# Patient Record
Sex: Female | Born: 1965 | Race: White | Hispanic: No | Marital: Married | Smoking: Never smoker
Health system: Southern US, Community
[De-identification: ages and names within clinical notes are randomized; demographics above are authoritative.]

## PROBLEM LIST (undated history)

## (undated) DIAGNOSIS — F32A Depression, unspecified: Secondary | ICD-10-CM

## (undated) DIAGNOSIS — F329 Major depressive disorder, single episode, unspecified: Secondary | ICD-10-CM

## (undated) HISTORY — DX: Major depressive disorder, single episode, unspecified: F32.9

## (undated) HISTORY — PX: BREAST LUMPECTOMY: SHX2

## (undated) HISTORY — PX: BREAST EXCISIONAL BIOPSY: SUR124

## (undated) HISTORY — PX: FOOT SURGERY: SHX648

## (undated) HISTORY — PX: NASAL SINUS SURGERY: SHX719

## (undated) HISTORY — DX: Depression, unspecified: F32.A

---

## 2005-08-09 ENCOUNTER — Encounter: Payer: Self-pay | Admitting: Family Medicine

## 2005-08-09 ENCOUNTER — Ambulatory Visit: Payer: Self-pay | Admitting: Obstetrics & Gynecology

## 2006-04-19 ENCOUNTER — Ambulatory Visit: Payer: Self-pay | Admitting: Family Medicine

## 2006-05-14 ENCOUNTER — Ambulatory Visit: Payer: Self-pay | Admitting: Family Medicine

## 2006-05-21 ENCOUNTER — Ambulatory Visit (HOSPITAL_COMMUNITY): Admission: RE | Admit: 2006-05-21 | Discharge: 2006-05-21 | Payer: Self-pay | Admitting: Gynecology

## 2006-05-29 ENCOUNTER — Ambulatory Visit (HOSPITAL_COMMUNITY): Admission: RE | Admit: 2006-05-29 | Discharge: 2006-05-29 | Payer: Self-pay | Admitting: Gynecology

## 2006-06-25 ENCOUNTER — Ambulatory Visit: Payer: Self-pay | Admitting: Family Medicine

## 2007-04-02 ENCOUNTER — Ambulatory Visit: Payer: Self-pay | Admitting: Family Medicine

## 2008-04-07 ENCOUNTER — Ambulatory Visit: Payer: Self-pay | Admitting: Family Medicine

## 2008-04-20 ENCOUNTER — Ambulatory Visit: Payer: Self-pay | Admitting: Obstetrics & Gynecology

## 2008-05-12 ENCOUNTER — Ambulatory Visit: Payer: Self-pay | Admitting: Obstetrics & Gynecology

## 2009-04-27 ENCOUNTER — Ambulatory Visit: Payer: Self-pay | Admitting: Family Medicine

## 2010-05-09 ENCOUNTER — Ambulatory Visit: Payer: Self-pay | Admitting: Obstetrics and Gynecology

## 2010-05-22 ENCOUNTER — Ambulatory Visit: Payer: 59 | Admitting: Obstetrics & Gynecology

## 2010-05-22 DIAGNOSIS — Z01419 Encounter for gynecological examination (general) (routine) without abnormal findings: Secondary | ICD-10-CM

## 2010-05-22 DIAGNOSIS — Z124 Encounter for screening for malignant neoplasm of cervix: Secondary | ICD-10-CM

## 2010-05-23 NOTE — Assessment & Plan Note (Signed)
[name], LEASON NO.:  [id number]  MEDICAL RECORD NO.:  [id number]           PATIENT TYPE:  LOCATION:  [facility] at [facility]           FACILITY:  PHYSICIAN:  Argentina Donovan, MD             DATE OF BIRTH:  DATE OF SERVICE:  05/22/2010                                 CLINIC NOTE  The patient is a 45 year old Caucasian female gravida 1, para 0-0-1-0 with no complaints.  She had been on Wellbutrin which she stopped sometime ago.  She has been fine, since then she uses a NuvaRing for contraception.  She has no complaints.  PHYSICAL EXAMINATION:  NECK:  Thyroid symmetrical.  No dominant masses, no nipple discharge, no supraclavicular, axillary nodes. Abdomen:  Soft, flat, and nontender.  No masses or organomegaly. GENITALIA:  External is normal.  BUS within normal limits.  Vagina is clean and well rugated.  Cervix is clean and nulliparous.  The uterus is anterior normal size, shape, consistency.  The adnexa could not be palpated. VITAL SIGNS:  The patient has a 130/84 blood pressure, 165 pounds, and is 5 feet 5 inches tall.  Normal gynecological examination by impression.  Prescription for NuvaRing and the patient will be scheduled for mammogram.          ______________________________ Argentina Donovan, MD    PR/MEDQ  D:  05/22/2010  T:  05/23/2010  Job:  [id number]

## 2010-06-14 ENCOUNTER — Ambulatory Visit: Payer: Self-pay | Admitting: Obstetrics and Gynecology

## 2010-06-27 NOTE — Assessment & Plan Note (Signed)
NAMEEVANGELYN, [name]                ACCOUNT NO.:  [id number]   MEDICAL RECORD NO.:  [id number]          PATIENT TYPE:  POB   LOCATION:  [facility] at [facility]         FACILITY:  [facility]   PHYSICIAN:  Tinnie Gens, MD        DATE OF BIRTH:  Jun 13, 1965   DATE OF SERVICE:                                  CLINIC NOTE   CHIEF COMPLAINT:  Yearly exam.   HISTORY OF PRESENT ILLNESS:  The patient is a 45 year old gravida 1,  para 0 is here for yearly exam.  She is without significant complaint  today.  She reports that she has really not been on the pills since her  miscarriage which was May of last year.  The patient some months does  not use anything and other months is still using condoms for birth  control.  She has regular cycles that last approximately 5 days but she  does have spotting on and off for 2 to 3 days.  She does desire me to  give her something for birth control in case she decides she is going to  use it.  She also is complaining of some mild bilateral pelvic pain and  left sided abdominal pain. She does have some endometriosis.  The  patient also complains of fatigue and she has gotten worse over the past  few months.   PAST MEDICAL HISTORY:  Negative.   PAST SURGICAL HISTORY:  1. Lumpectomy right breast.  2. Two foot surgeries.  3. Sinus surgery.   ALLERGIES:  None known.   MEDICATIONS:  Multivitamin somedays.   GYN HISTORY:  LMP 03/19/07, no history of STD or abnormal Pap smear. See  HPI.   OBSTETRICAL HISTORY:  G1, P0 with one SAB.   FAMILY HISTORY:  Paternal aunt with breast cancer.  Father had non-  Hodgkin's lymphoma.   SOCIAL HISTORY:  She works Education officer, environmental houses. No tobacco or drug use.  Social alcohol use.   REVIEW OF SYSTEMS:  12 point systems reviewed, negative for headache,  vision change, nausea, vomiting, diarrhea, constipation, chest pain,  shortness of breath, abdominal pain, lower extremity swelling, breast  mass, nipple discharge, vaginal  discharge or abnormal vaginal lesions.   PHYSICAL EXAMINATION:  VITALS:  On exam weight is 186, blood pressure is  127/77, pulse 96.  GENERAL:  She is a well-nourished female in no acute distress.  HEENT:  Normocephalic, atraumatic.  Sclerae anicteric.  NECK:  Supple.  Normal thyroid.  LUNGS:  Clear bilaterally.  CV:  Regular rate and rhythm. No murmurs, gallops or murmurs.  ABDOMEN:  Soft, nontender, nondistended.  No masses.  EXTREMITIES:  No cyanosis, clubbing or edema.  2+ distal pulses.  BREASTS:  Symmetric with everted nipples.  No masses. No supraclavicular  or axillary adenopathy.  GU:  Normal external female genitalia.  BUS is normal.  Vagina is pink  and rugated.  Cervix is nulliparous without lesion.  Uterus is small,  anteverted.  Right adnexa __________ , there may be some fullness on the  left side with some mild tenderness associated with that.   IMPRESSION:  1. Yearly exam.  2. Left-sided pelvic pain.  3.  Fatigue.   PLAN:  Pap smear today.  TSH, CBC, BMP, lipid panel, GYN ultrasound for  left lower quadrant pain. Follow up in several weeks to review these  results.           ______________________________  Tinnie Gens, MD     TP/MEDQ  D:  04/02/2007  T:  04/03/2007  Job:  [id number]

## 2010-06-27 NOTE — Assessment & Plan Note (Signed)
NAMEVICKIE, [name]                ACCOUNT NO.:  [id number]   MEDICAL RECORD NO.:  [id number]          PATIENT TYPE:  POB   LOCATION:  [facility]         FACILITY:  [facility]   PHYSICIAN:  Tinnie Gens, MD        DATE OF BIRTH:  02-10-1966   DATE OF SERVICE:  04/07/2008                                  CLINIC NOTE   CHIEF COMPLAINT:  Yearly exam.   HISTORY OF PRESENT ILLNESS:  The patient is a 45 year old gravida 1,  para 0 who is here for yearly exam.  She is without significant  complaint today.  She has previously been prescribed Wellbutrin, which  she has started taking again as it makes her feel better.  She has a  strong family history of depression and just feels like she does better,  is less worried, and less agitated when she is on the medication.  This  has not been written for her for over a year, but she had an old  prescription which she started taking, and then she borrowed some from  her brother.  She would like to continue this.  She is currently on the  NuvaRing which works well for her.  She does have some spotting, but she  reports that has been better over the last 6 months.  She was interested  in discussion of IUD versus continuing her NuvaRing.   PAST MEDICAL HISTORY:  Negative.   PAST SURGICAL HISTORY:  Lumpectomy of right breast, two foot surgeries,  and a sinus surgery.   ALLERGIES:  None known.   MEDICATIONS:  She is currently on multivitamin daily and NuvaRing as  directed.   GYNECOLOGIC HISTORY:  LMP, March 21, 2008.  Last Pap, April 02, 2007, was normal.  No history of STD.   OBSTETRICAL HISTORY:  G1, P0, 1 SAB.   FAMILY HISTORY:  Hypertension in her mother, paternal aunt with breast  cancer, and father with non-Hodgkin lymphoma.   SOCIAL HISTORY:  She works Education officer, environmental houses.  No tobacco or drug use.  Social alcohol user.   REVIEW OF SYSTEMS:  A 14-point review of systems is reviewed, negative  for headache, vision changes,  nausea, vomiting, diarrhea, constipation,  blood in her stools, blood in her urine, difficulty passing urine, chest  pain, shortness of breath, abdominal pain, lower extremity swelling,  breast mass, nipple discharge, vaginal discharge, or pelvic pain.   PHYSICAL EXAMINATION:  VITAL SIGNS:  Her weight is down to 181.  She is  currently using [employer] for this.  Blood pressure is 130/85  today.  GENERAL:  She is a well-nourished, well-nourished female in no acute  distress.  HEENT:  Normocephalic, atraumatic.  Sclerae anicteric.  NECK:  Supple.  Normal thyroid.  LUNGS:  Clear bilaterally.  CV:  Regular rate and rhythm.  No rubs, gallops, murmurs.  ABDOMEN:  Soft, nontender, nondistended.  No masses.  EXTREMITIES:  No cyanosis, clubbing, or edema.  Distal pulses 2+.  BREASTS:  Symmetric with everted nipples.  No masses.  No  supraclavicular or axillary adenopathy noted.  Well-healed scar over the  patient's right nipple is noted.  GU:  Normal external female genitalia, BUS normal.  Vagina is pink and  rugated.  Cervix is nulliparous without lesion.  Uterus is small,  approximately 6-8 weeks size, anteverted.  No adnexal mass or  tenderness.   IMPRESSION:  1. Yearly exam.  2. Depression.   PLAN:  1. Pap smear today.  2. Scheduled for mammogram as the patient has reached the age of 53.      The patient had full lab panel last year which was all normal prior      to this and does not need this repeated for 3-5 years.  3. I have refilled her NuvaRing for the next year.  4. I have given her a prescription for Wellbutrin XR 150 mg 1 p.o.      daily #30 p.r.n. refills.  The patient is also given information      about Mirena IUD and will call back to schedule this if she is      interested.           ______________________________  Tinnie Gens, MD     TP/MEDQ  D:  04/07/2008  T:  04/07/2008  Job:  [id number]
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
## 2010-06-30 NOTE — Group Therapy Note (Signed)
[name], [name]                ACCOUNT NO.:  [id number]   MEDICAL RECORD NO.:  [id number]          PATIENT TYPE:  POB   LOCATION:  [facility]                   FACILITY:  [facility]   PHYSICIAN:  Tinnie Gens, MD        DATE OF BIRTH:  Apr 30, 1965   DATE OF SERVICE:  08/09/2005                                    CLINIC NOTE   CHIEF COMPLAINT:  Physical exam.   HISTORY OF PRESENT ILLNESS:  The patient is a 45 year old nulligravida who  comes in for her yearly exam.  She is typically followed by Dr. Mia Creek.  She has previously been on multiple different doses of estrogen for birth  control and reports some bright red bleeding with almost all of the  different pills she has been on lately.  She would like to change to a new  kind of birth control.  Otherwise, she is without complaints.  She is  considering fertility sometime in the future.  She continues on a  multivitamin a day.   PAST MEDICAL HISTORY:  Negative.   PAST SURGICAL HISTORY:  Includes a lumpectomy of a cyst on her right breast  and two foot surgeries and sinus surgery.   ALLERGIES:  NONE KNOWN.   MEDICATIONS:  Loestrin 24, multivitamin daily.   GYNECOLOGICAL HISTORY:  LMP Jul 09, 2005, last Pap July 24, 2004, last  mammogram June 03, 2002.  She denies STDs or abnormal Pap smears.  Cycles  are regular.   FAMILY HISTORY:  Significant for breast cancer.  She has a paternal aunt who  had breast cancer.  Her father is recently deceased of non-Hodgkin lymphoma.   SOCIAL HISTORY:  No tobacco, social alcohol use.   OBSTETRICAL HISTORY:  She is nulligravida.   REVIEW OF SYSTEMS:  A 14-point review of systems reviewed and is negative  for headache, vision changes, nausea, vomiting, diarrhea, constipation,  chest pain, shortness of breath, abdominal pain, lower extremity swelling,  breast masses, nipple discharge, vaginal discharge, or abnormal vaginal  lesions such as in the HPI.   PHYSICAL EXAMINATION:  VITAL SIGNS:   Blood pressure 124/64, weight 174,  pulse 68.  GENERAL:  She is a well-developed, well-nourished white female in no acute  distress.  NECK:  Supple with normal thyroid.  LUNGS:  Clear bilaterally.  CARDIOVASCULAR:  Regular rate and rhythm without rubs, gallops, or murmurs.  ABDOMEN:  Soft, nontender, nondistended.  BREASTS:  Symmetric with everted nipples, no supraclavicular axillary  adenopathy, no breast masses noted.  EXTREMITIES:  No cyanosis, clubbing, or edema.  GU:  Normal external female genitalia, the vagina is pink and rugated, BUS  was normal.  The cervix is normal without lesions.  The uterus is small and  anteverted without adnexal mass or tenderness.   IMPRESSION:  1.  Yearly exam.  2.  Abnormal bleeding with Loestrin estrogen pill.   PLAN:  1.  Pap smear today.  2.  Trial of NuvaRing.  The patient was given samples.  She will call back      in if this is not working for her for a  change in pills.  We will try a      135 pill with her next.  If she likes the NuvaRing we can call in a      prescription for that.  She will let us know how she is doing.           ______________________________  Tinnie Gens, MD     TP/MEDQ  D:  08/09/2005  T:  08/09/2005  Job:  [id number]

## 2011-08-06 ENCOUNTER — Other Ambulatory Visit: Payer: Self-pay | Admitting: Family Medicine

## 2011-08-06 ENCOUNTER — Ambulatory Visit (INDEPENDENT_AMBULATORY_CARE_PROVIDER_SITE_OTHER): Payer: 59 | Admitting: Family Medicine

## 2011-08-06 ENCOUNTER — Encounter: Payer: Self-pay | Admitting: Family Medicine

## 2011-08-06 VITALS — BP 123/84 | HR 91 | Ht 65.0 in | Wt 177.0 lb

## 2011-08-06 DIAGNOSIS — Z124 Encounter for screening for malignant neoplasm of cervix: Secondary | ICD-10-CM

## 2011-08-06 DIAGNOSIS — Z309 Encounter for contraceptive management, unspecified: Secondary | ICD-10-CM

## 2011-08-06 DIAGNOSIS — Z1151 Encounter for screening for human papillomavirus (HPV): Secondary | ICD-10-CM

## 2011-08-06 DIAGNOSIS — E669 Obesity, unspecified: Secondary | ICD-10-CM

## 2011-08-06 DIAGNOSIS — IMO0001 Reserved for inherently not codable concepts without codable children: Secondary | ICD-10-CM

## 2011-08-06 DIAGNOSIS — Z01419 Encounter for gynecological examination (general) (routine) without abnormal findings: Secondary | ICD-10-CM

## 2011-08-06 MED ORDER — ETONOGESTREL-ETHINYL ESTRADIOL 0.12-0.015 MG/24HR VA RING: VAGINAL_RING | VAGINAL | Status: DC

## 2011-08-06 MED ORDER — PHENTERMINE HCL 37.5 MG PO CAPS: 37.5000 mg | ORAL_CAPSULE | ORAL | Status: DC

## 2011-08-06 NOTE — Patient Instructions (Addendum)
Exercise to Lose Weight Exercise and a healthy diet may help you lose weight. Your doctor may suggest specific exercises. EXERCISE IDEAS AND TIPS  Choose low-cost things you enjoy doing, such as walking, bicycling, or exercising to workout videos.   Take stairs instead of the elevator.   Walk during your lunch break.   Park your car further away from work or school.   Go to a gym or an exercise class.   Start with 5 to 10 minutes of exercise each day. Build up to 30 minutes of exercise 4 to 6 days a week.   Wear shoes with good support and comfortable clothes.   Stretch before and after working out.   Work out until you breathe harder and your heart beats faster.   Drink extra water when you exercise.   Do not do so much that you hurt yourself, feel dizzy, or get very short of breath.  Exercises that burn about 150 calories:  Running 1  miles in 15 minutes.   Playing volleyball for 45 to 60 minutes.   Washing and waxing a car for 45 to 60 minutes.   Playing touch football for 45 minutes.   Walking 1  miles in 35 minutes.   Pushing a stroller 1  miles in 30 minutes.   Playing basketball for 30 minutes.   Raking leaves for 30 minutes.   Bicycling 5 miles in 30 minutes.   Walking 2 miles in 30 minutes.   Dancing for 30 minutes.   Shoveling snow for 15 minutes.   Swimming laps for 20 minutes.   Walking up stairs for 15 minutes.   Bicycling 4 miles in 15 minutes.   Gardening for 30 to 45 minutes.   Jumping rope for 15 minutes.   Washing windows or floors for 45 to 60 minutes.  Document Released: 03/03/2010 Document Revised: 01/18/2011 Document Reviewed: 03/03/2010 [facility] Patient Information 2012 [place], [place].Preventive Care for Adults, Female A healthy lifestyle and preventive care can promote health and wellness. Preventive health guidelines for women include the following key practices.  A routine yearly physical is a good way to check with your  caregiver about your health and preventive screening. It is a chance to share any concerns and updates on your health, and to receive a thorough exam.   Visit your dentist for a routine exam and preventive care every 6 months. Brush your teeth twice a day and floss once a day. Good oral hygiene prevents tooth decay and gum disease.   The frequency of eye exams is based on your age, health, family medical history, use of contact lenses, and other factors. Follow your caregiver's recommendations for frequency of eye exams.   Eat a healthy diet. Foods like vegetables, fruits, whole grains, low-fat dairy products, and lean protein foods contain the nutrients you need without too many calories. Decrease your intake of foods high in solid fats, added sugars, and salt. Eat the right amount of calories for you.Get information about a proper diet from your caregiver, if necessary.   Regular physical exercise is one of the most important things you can do for your health. Most adults should get at least 150 minutes of moderate-intensity exercise (any activity that increases your heart rate and causes you to sweat) each week. In addition, most adults need muscle-strengthening exercises on 2 or more days a week.   Maintain a healthy weight. The body mass index (BMI) is a screening tool to identify possible weight problems. It  provides an estimate of body fat based on height and weight. Your caregiver can help determine your BMI, and can help you achieve or maintain a healthy weight.For adults 20 years and older:   A BMI below 18.5 is considered underweight.   A BMI of 18.5 to 24.9 is normal.   A BMI of 25 to 29.9 is considered overweight.   A BMI of 30 and above is considered obese.   Maintain normal blood lipids and cholesterol levels by exercising and minimizing your intake of saturated fat. Eat a balanced diet with plenty of fruit and vegetables. Blood tests for lipids and cholesterol should begin at  age 58 and be repeated every 5 years. If your lipid or cholesterol levels are high, you are over 50, or you are at high risk for heart disease, you may need your cholesterol levels checked more frequently.Ongoing high lipid and cholesterol levels should be treated with medicines if diet and exercise are not effective.   If you smoke, find out from your caregiver how to quit. If you do not use tobacco, do not start.   If you are pregnant, do not drink alcohol. If you are breastfeeding, be very cautious about drinking alcohol. If you are not pregnant and choose to drink alcohol, do not exceed 1 drink per day. One drink is considered to be 12 ounces (355 mL) of beer, 5 ounces (148 mL) of wine, or 1.5 ounces (44 mL) of liquor.   Avoid use of street drugs. Do not share needles with anyone. Ask for help if you need support or instructions about stopping the use of drugs.   High blood pressure causes heart disease and increases the risk of stroke. Your blood pressure should be checked at least every 1 to 2 years. Ongoing high blood pressure should be treated with medicines if weight loss and exercise are not effective.   If you are 1 to 44 years old, ask your caregiver if you should take aspirin to prevent strokes.   Diabetes screening involves taking a blood sample to check your fasting blood sugar level. This should be done once every 3 years, after age 28, if you are within normal weight and without risk factors for diabetes. Testing should be considered at a younger age or be carried out more frequently if you are overweight and have at least 1 risk factor for diabetes.   Breast cancer screening is essential preventive care for women. You should practice "breast self-awareness." This means understanding the normal appearance and feel of your breasts and may include breast self-examination. Any changes detected, no matter how small, should be reported to a caregiver. Women in their 40s and 30s should  have a clinical breast exam (CBE) by a caregiver as part of a regular health exam every 1 to 3 years. After age 63, women should have a CBE every year. Starting at age 35, women should consider having a mammography (breast X-ray test) every year. Women who have a family history of breast cancer should talk to their caregiver about genetic screening. Women at a high risk of breast cancer should talk to their caregivers about having magnetic resonance imaging (MRI) and a mammography every year.   The Pap test is a screening test for cervical cancer. A Pap test can show cell changes on the cervix that might become cervical cancer if left untreated. A Pap test is a procedure in which cells are obtained and examined from the lower end of the uterus (  cervix).   Women should have a Pap test starting at age 15.   Between ages 13 and 46, Pap tests should be repeated every 2 years.   Beginning at age 14, you should have a Pap test every 3 years as long as the past 3 Pap tests have been normal.   Some women have medical problems that increase the chance of getting cervical cancer. Talk to your caregiver about these problems. It is especially important to talk to your caregiver if a new problem develops soon after your last Pap test. In these cases, your caregiver may recommend more frequent screening and Pap tests.   The above recommendations are the same for women who have or have not gotten the vaccine for human papillomavirus (HPV).   If you had a hysterectomy for a problem that was not cancer or a condition that could lead to cancer, then you no longer need Pap tests. Even if you no longer need a Pap test, a regular exam is a good idea to make sure no other problems are starting.   If you are between ages 41 and 2, and you have had normal Pap tests going back 10 years, you no longer need Pap tests. Even if you no longer need a Pap test, a regular exam is a good idea to make sure no other problems are  starting.   If you have had past treatment for cervical cancer or a condition that could lead to cancer, you need Pap tests and screening for cancer for at least 20 years after your treatment.   If Pap tests have been discontinued, risk factors (such as a new sexual partner) need to be reassessed to determine if screening should be resumed.   The HPV test is an additional test that may be used for cervical cancer screening. The HPV test looks for the virus that can cause the cell changes on the cervix. The cells collected during the Pap test can be tested for HPV. The HPV test could be used to screen women aged 87 years and older, and should be used in women of any age who have unclear Pap test results. After the age of 36, women should have HPV testing at the same frequency as a Pap test.   Colorectal cancer can be detected and often prevented. Most routine colorectal cancer screening begins at the age of 10 and continues through age 23. However, your caregiver may recommend screening at an earlier age if you have risk factors for colon cancer. On a yearly basis, your caregiver may provide home test kits to check for hidden blood in the stool. Use of a small camera at the end of a tube, to directly examine the colon (sigmoidoscopy or colonoscopy), can detect the earliest forms of colorectal cancer. Talk to your caregiver about this at age 88, when routine screening begins. Direct examination of the colon should be repeated every 5 to 10 years through age 35, unless early forms of pre-cancerous polyps or small growths are found.   Hepatitis C blood testing is recommended for all people born from [birth year] through 1965 and any individual with known risks for hepatitis C.   Practice safe sex. Use condoms and avoid high-risk sexual practices to reduce the spread of sexually transmitted infections (STIs). STIs include gonorrhea, chlamydia, syphilis, trichomonas, herpes, HPV, and human immunodeficiency virus  (HIV). Herpes, HIV, and HPV are viral illnesses that have no cure. They can result in disability, cancer, and death. Sexually  active women aged 27 and younger should be checked for chlamydia. Older women with new or multiple partners should also be tested for chlamydia. Testing for other STIs is recommended if you are sexually active and at increased risk.   Osteoporosis is a disease in which the bones lose minerals and strength with aging. This can result in serious bone fractures. The risk of osteoporosis can be identified using a bone density scan. Women ages 39 and over and women at risk for fractures or osteoporosis should discuss screening with their caregivers. Ask your caregiver whether you should take a calcium supplement or vitamin D to reduce the rate of osteoporosis.   Menopause can be associated with physical symptoms and risks. Hormone replacement therapy is available to decrease symptoms and risks. You should talk to your caregiver about whether hormone replacement therapy is right for you.   Use sunscreen with sun protection factor (SPF) of 30 or more. Apply sunscreen liberally and repeatedly throughout the day. You should seek shade when your shadow is shorter than you. Protect yourself by wearing long sleeves, pants, a wide-brimmed hat, and sunglasses year round, whenever you are outdoors.   Once a month, do a whole body skin exam, using a mirror to look at the skin on your back. Notify your caregiver of new moles, moles that have irregular borders, moles that are larger than a pencil eraser, or moles that have changed in shape or color.   Stay current with required immunizations.   Influenza. You need a dose every fall (or winter). The composition of the flu vaccine changes each year, so being vaccinated once is not enough.   Pneumococcal polysaccharide. You need 1 to 2 doses if you smoke cigarettes or if you have certain chronic medical conditions. You need 1 dose at age 51 (or  older) if you have never been vaccinated.   Tetanus, diphtheria, pertussis (Tdap, Td). Get 1 dose of Tdap vaccine if you are younger than age 35, are over 30 and have contact with an infant, are a Research scientist (physical sciences), are pregnant, or simply want to be protected from whooping cough. After that, you need a Td booster dose every 10 years. Consult your caregiver if you have not had at least 3 tetanus and diphtheria-containing shots sometime in your life or have a deep or dirty wound.   HPV. You need this vaccine if you are a woman age 65 or younger. The vaccine is given in 3 doses over 6 months.   Measles, mumps, rubella (MMR). You need at least 1 dose of MMR if you were born in 1957 or later. You may also need a second dose.   Meningococcal. If you are age 33 to 75 and a first-year college student living in a residence hall, or have one of several medical conditions, you need to get vaccinated against meningococcal disease. You may also need additional booster doses.   Zoster (shingles). If you are age 52 or older, you should get this vaccine.   Varicella (chickenpox). If you have never had chickenpox or you were vaccinated but received only 1 dose, talk to your caregiver to find out if you need this vaccine.   Hepatitis A. You need this vaccine if you have a specific risk factor for hepatitis A virus infection or you simply wish to be protected from this disease. The vaccine is usually given as 2 doses, 6 to 18 months apart.   Hepatitis B. You need this vaccine if you have a  specific risk factor for hepatitis B virus infection or you simply wish to be protected from this disease. The vaccine is given in 3 doses, usually over 6 months.  Preventive Services / Frequency Ages 85 to 1  Blood pressure check.** / Every 1 to 2 years.   Lipid and cholesterol check.** / Every 5 years beginning at age 66.   Clinical breast exam.** / Every 3 years for women in their 7s and 30s.   Pap test.** / Every 2  years from ages 36 through 67. Every 3 years starting at age 71 through age 18 or 87 with a history of 3 consecutive normal Pap tests.   HPV screening.** / Every 3 years from ages 79 through ages 34 to 38 with a history of 3 consecutive normal Pap tests.   Hepatitis C blood test.** / For any individual with known risks for hepatitis C.   Skin self-exam. / Monthly.   Influenza immunization.** / Every year.   Pneumococcal polysaccharide immunization.** / 1 to 2 doses if you smoke cigarettes or if you have certain chronic medical conditions.   Tetanus, diphtheria, pertussis (Tdap, Td) immunization. / A one-time dose of Tdap vaccine. After that, you need a Td booster dose every 10 years.   HPV immunization. / 3 doses over 6 months, if you are 9 and younger.   Measles, mumps, rubella (MMR) immunization. / You need at least 1 dose of MMR if you were born in 1957 or later. You may also need a second dose.   Meningococcal immunization. / 1 dose if you are age 31 to 25 and a first-year college student living in a residence hall, or have one of several medical conditions, you need to get vaccinated against meningococcal disease. You may also need additional booster doses.   Varicella immunization.** / Consult your caregiver.   Hepatitis A immunization.** / Consult your caregiver. 2 doses, 6 to 18 months apart.   Hepatitis B immunization.** / Consult your caregiver. 3 doses usually over 6 months.  Ages 4 to 45  Blood pressure check.** / Every 1 to 2 years.   Lipid and cholesterol check.** / Every 5 years beginning at age 40.   Clinical breast exam.** / Every year after age 21.   Mammogram.** / Every year beginning at age 71 and continuing for as long as you are in good health. Consult with your caregiver.   Pap test.** / Every 3 years starting at age 3 through age 14 or 39 with a history of 3 consecutive normal Pap tests.   HPV screening.** / Every 3 years from ages 52 through ages 64 to  104 with a history of 3 consecutive normal Pap tests.   Fecal occult blood test (FOBT) of stool. / Every year beginning at age 20 and continuing until age 61. You may not need to do this test if you get a colonoscopy every 10 years.   Flexible sigmoidoscopy or colonoscopy.** / Every 5 years for a flexible sigmoidoscopy or every 10 years for a colonoscopy beginning at age 33 and continuing until age 39.   Hepatitis C blood test.** / For all people born from [birth year] through 1963 and any individual with known risks for hepatitis C.   Skin self-exam. / Monthly.   Influenza immunization.** / Every year.   Pneumococcal polysaccharide immunization.** / 1 to 2 doses if you smoke cigarettes or if you have certain chronic medical conditions.   Tetanus, diphtheria, pertussis (Tdap, Td) immunization.** / A  one-time dose of Tdap vaccine. After that, you need a Td booster dose every 10 years.   Measles, mumps, rubella (MMR) immunization. / You need at least 1 dose of MMR if you were born in 1957 or later. You may also need a second dose.   Varicella immunization.** / Consult your caregiver.   Meningococcal immunization.** / Consult your caregiver.   Hepatitis A immunization.** / Consult your caregiver. 2 doses, 6 to 18 months apart.   Hepatitis B immunization.** / Consult your caregiver. 3 doses, usually over 6 months.  Ages 46 and over  Blood pressure check.** / Every 1 to 2 years.   Lipid and cholesterol check.** / Every 5 years beginning at age 81.   Clinical breast exam.** / Every year after age 57.   Mammogram.** / Every year beginning at age 55 and continuing for as long as you are in good health. Consult with your caregiver.   Pap test.** / Every 3 years starting at age 25 through age 69 or 62 with a 3 consecutive normal Pap tests. Testing can be stopped between 65 and 70 with 3 consecutive normal Pap tests and no abnormal Pap or HPV tests in the past 10 years.   HPV screening.** / Every 3  years from ages 45 through ages 23 or 52 with a history of 3 consecutive normal Pap tests. Testing can be stopped between 65 and 70 with 3 consecutive normal Pap tests and no abnormal Pap or HPV tests in the past 10 years.   Fecal occult blood test (FOBT) of stool. / Every year beginning at age 69 and continuing until age 82. You may not need to do this test if you get a colonoscopy every 10 years.   Flexible sigmoidoscopy or colonoscopy.** / Every 5 years for a flexible sigmoidoscopy or every 10 years for a colonoscopy beginning at age 43 and continuing until age 75.   Hepatitis C blood test.** / For all people born from [birth year] through 1965 and any individual with known risks for hepatitis C.   Osteoporosis screening.** / A one-time screening for women ages 68 and over and women at risk for fractures or osteoporosis.   Skin self-exam. / Monthly.   Influenza immunization.** / Every year.   Pneumococcal polysaccharide immunization.** / 1 dose at age 76 (or older) if you have never been vaccinated.   Tetanus, diphtheria, pertussis (Tdap, Td) immunization. / A one-time dose of Tdap vaccine if you are over 65 and have contact with an infant, are a Research scientist (physical sciences), or simply want to be protected from whooping cough. After that, you need a Td booster dose every 10 years.   Varicella immunization.** / Consult your caregiver.   Meningococcal immunization.** / Consult your caregiver.   Hepatitis A immunization.** / Consult your caregiver. 2 doses, 6 to 18 months apart.   Hepatitis B immunization.** / Check with your caregiver. 3 doses, usually over 6 months.  ** Family history and personal history of risk and conditions may change your caregiver's recommendations. Document Released: 03/27/2001 Document Revised: 01/18/2011 Document Reviewed: 06/26/2010 [facility] Patient Information 2012 [place], [place].

## 2011-08-06 NOTE — Progress Notes (Signed)
Yearly exam/pap/tn

## 2011-08-06 NOTE — Progress Notes (Signed)
  Subjective:     [name] is a 46 y.o. female and is here for a comprehensive physical exam. The patient reports problems - interested in weight loss.  Having some spotting with Nuva Ring but otherwise happy with this method.  Became a Grandmother recently.Marland Kitchen  History   Social History  . Marital Status: Married    Spouse Name: N/A    Number of Children: N/A  . Years of Education: N/A   Occupational History  . Not on file.   Social History Main Topics  . Smoking status: Never Smoker   . Smokeless tobacco: Never Used  . Alcohol Use: No  . Drug Use: No  . Sexually Active: Yes -- Female partner(s)    Birth Control/ Protection: Inserts     nuva ring   Other Topics Concern  . Not on file   Social History Narrative  . No narrative on file   Health Maintenance  Topic Date Due  . Pap Smear  01/07/1986  . Tetanus/tdap  01/08/1987  . Influenza Vaccine  11/13/2011    The following portions of the patient's history were reviewed and updated as appropriate: allergies, current medications, past family history, past medical history, past social history, past surgical history and problem list.  Review of Systems A comprehensive review of systems was negative.   Objective:    BP 123/84  Pulse 91  Ht 5\' 5"  (1.651 m)  Wt 177 lb (80.287 kg)  BMI 29.45 kg/m2  LMP 07/23/2011 General appearance: alert, cooperative and appears stated age Head: Normocephalic, without obvious abnormality, atraumatic Neck: no adenopathy, supple, symmetrical, trachea midline and thyroid not enlarged, symmetric, no tenderness/mass/nodules Lungs: clear to auscultation bilaterally Breasts: normal appearance, no masses or tenderness Heart: regular rate and rhythm, S1, S2 normal, no murmur, click, rub or gallop Abdomen: soft, non-tender; bowel sounds normal; no masses,  no organomegaly Pelvic: cervix normal in appearance, external genitalia normal, no adnexal masses or tenderness, no cervical motion  tenderness, uterus normal size, shape, and consistency, vagina normal without discharge and Nuva ring in place Extremities: extremities normal, atraumatic, no cyanosis or edema Pulses: 2+ and symmetric Skin: Skin color, texture, turgor normal. No rashes or lesions Lymph nodes: Cervical, supraclavicular, and axillary nodes normal. Neurologic: Grossly normal    Assessment:    Healthy female exam. Obesity     Plan:  Pap smear, Mammogram, two months of Phentermine.  Gets yearly labs with [facility]. See After Visit Summary for Counseling Recommendations

## 2011-08-11 LAB — PAP LB AND HPV HIGH-RISK: HPV, high-risk: NEGATIVE

## 2011-08-13 ENCOUNTER — Encounter: Payer: Self-pay | Admitting: Family Medicine

## 2012-10-29 ENCOUNTER — Ambulatory Visit (INDEPENDENT_AMBULATORY_CARE_PROVIDER_SITE_OTHER): Payer: 59 | Admitting: Obstetrics and Gynecology

## 2012-10-29 ENCOUNTER — Encounter: Payer: Self-pay | Admitting: Obstetrics and Gynecology

## 2012-10-29 VITALS — BP 131/99 | HR 81 | Ht 65.0 in | Wt 175.0 lb

## 2012-10-29 DIAGNOSIS — Z124 Encounter for screening for malignant neoplasm of cervix: Secondary | ICD-10-CM

## 2012-10-29 DIAGNOSIS — Z01419 Encounter for gynecological examination (general) (routine) without abnormal findings: Secondary | ICD-10-CM

## 2012-10-29 NOTE — Progress Notes (Addendum)
  Subjective:     [name] is a 47 y.o. female G1P0 with LMP 10/17/2012 with BMI 29 who is here for a comprehensive physical exam. The patient reports no problems. Patient is still using NuvaRing for contraception without any issues.  History   Social History  . Marital Status: Married    Spouse Name: N/A    Number of Children: N/A  . Years of Education: N/A   Occupational History  . Not on file.   Social History Main Topics  . Smoking status: Never Smoker   . Smokeless tobacco: Never Used  . Alcohol Use: No  . Drug Use: No  . Sexual Activity: Yes    Partners: Male    Birth Control/ Protection: Inserts     Comment: nuva ring   Other Topics Concern  . Not on file   Social History Narrative  . No narrative on file   Health Maintenance  Topic Date Due  . Tetanus/tdap  01/08/1987  . Influenza Vaccine  09/12/2012  . Pap Smear  08/13/2014   Past Medical History  Diagnosis Date  . Depression    Past Surgical History  Procedure Laterality Date  . Breast lumpectomy      RIGHT BREAST  . Nasal sinus surgery    . Foot surgery      X2   Family History  Problem Relation Age of Onset  . Cancer Paternal Aunt     BREAST  . Cancer Father     Non-Hodgkins Lymphoma  . Hypertension Mother    History  Substance Use Topics  . Smoking status: Never Smoker   . Smokeless tobacco: Never Used  . Alcohol Use: No       Review of Systems A comprehensive review of systems was negative.   Objective:      GENERAL: Well-developed, well-nourished female in no acute distress. Obese HEENT: Normocephalic, atraumatic. Sclerae anicteric.  NECK: Supple. Normal thyroid.  LUNGS: Clear to auscultation bilaterally.  HEART: Regular rate and rhythm. BREASTS: Symmetric in size. No palpable masses or lymphadenopathy, skin changes, or nipple drainage. ABDOMEN: Soft, nontender, nondistended. Obese PELVIC: Normal external female genitalia. Vagina is pink and rugated.  Normal discharge.  Normal appearing cervix. NuvaRing in place. Uterus is normal in size. No adnexal mass or tenderness. EXTREMITIES: No cyanosis, clubbing, or edema, 2+ distal pulses.    Assessment:    Healthy female exam.      Plan:    pap smear collected Referral for screening mammography provided Encouraged to continue weight loss efforts Also advised to follow up with PCP regarding elevated BP. Patient states that her BP is always elevated only when she comes to MD office. Informed patients of risks associated with the use of combined hormonal contraception and HTN. Advised to perform monthly self breast and vulva exams See After Visit Summary for Counseling Recommendations

## 2012-10-29 NOTE — Patient Instructions (Signed)
Preventive Care for Adults, Female A healthy lifestyle and preventive care can promote health and wellness. Preventive health guidelines for women include the following key practices.  A routine yearly physical is a good way to check with your caregiver about your health and preventive screening. It is a chance to share any concerns and updates on your health, and to receive a thorough exam.  Visit your dentist for a routine exam and preventive care every 6 months. Brush your teeth twice a day and floss once a day. Good oral hygiene prevents tooth decay and gum disease.  The frequency of eye exams is based on your age, health, family medical history, use of contact lenses, and other factors. Follow your caregiver's recommendations for frequency of eye exams.  Eat a healthy diet. Foods like vegetables, fruits, whole grains, low-fat dairy products, and lean protein foods contain the nutrients you need without too many calories. Decrease your intake of foods high in solid fats, added sugars, and salt. Eat the right amount of calories for you.Get information about a proper diet from your caregiver, if necessary.  Regular physical exercise is one of the most important things you can do for your health. Most adults should get at least 150 minutes of moderate-intensity exercise (any activity that increases your heart rate and causes you to sweat) each week. In addition, most adults need muscle-strengthening exercises on 2 or more days a week.  Maintain a healthy weight. The body mass index (BMI) is a screening tool to identify possible weight problems. It provides an estimate of body fat based on height and weight. Your caregiver can help determine your BMI, and can help you achieve or maintain a healthy weight.For adults 20 years and older:  A BMI below 18.5 is considered underweight.  A BMI of 18.5 to 24.9 is normal.  A BMI of 25 to 29.9 is considered overweight.  A BMI of 30 and above is  considered obese.  Maintain normal blood lipids and cholesterol levels by exercising and minimizing your intake of saturated fat. Eat a balanced diet with plenty of fruit and vegetables. Blood tests for lipids and cholesterol should begin at age 20 and be repeated every 5 years. If your lipid or cholesterol levels are high, you are over 50, or you are at high risk for heart disease, you may need your cholesterol levels checked more frequently.Ongoing high lipid and cholesterol levels should be treated with medicines if diet and exercise are not effective.  If you smoke, find out from your caregiver how to quit. If you do not use tobacco, do not start.  If you are pregnant, do not drink alcohol. If you are breastfeeding, be very cautious about drinking alcohol. If you are not pregnant and choose to drink alcohol, do not exceed 1 drink per day. One drink is considered to be 12 ounces (355 mL) of beer, 5 ounces (148 mL) of wine, or 1.5 ounces (44 mL) of liquor.  Avoid use of street drugs. Do not share needles with anyone. Ask for help if you need support or instructions about stopping the use of drugs.  High blood pressure causes heart disease and increases the risk of stroke. Your blood pressure should be checked at least every 1 to 2 years. Ongoing high blood pressure should be treated with medicines if weight loss and exercise are not effective.  If you are 55 to 45 years old, ask your caregiver if you should take aspirin to prevent strokes.  Diabetes   screening involves taking a blood sample to check your fasting blood sugar level. This should be done once every 3 years, after age 45, if you are within normal weight and without risk factors for diabetes. Testing should be considered at a younger age or be carried out more frequently if you are overweight and have at least 1 risk factor for diabetes.  Breast cancer screening is essential preventive care for women. You should practice "breast  self-awareness." This means understanding the normal appearance and feel of your breasts and may include breast self-examination. Any changes detected, no matter how small, should be reported to a caregiver. Women in their 20s and 30s should have a clinical breast exam (CBE) by a caregiver as part of a regular health exam every 1 to 3 years. After age 40, women should have a CBE every year. Starting at age 40, women should consider having a mammography (breast X-ray test) every year. Women who have a family history of breast cancer should talk to their caregiver about genetic screening. Women at a high risk of breast cancer should talk to their caregivers about having magnetic resonance imaging (MRI) and a mammography every year.  The Pap test is a screening test for cervical cancer. A Pap test can show cell changes on the cervix that might become cervical cancer if left untreated. A Pap test is a procedure in which cells are obtained and examined from the lower end of the uterus (cervix).  Women should have a Pap test starting at age 21.  Between ages 21 and 29, Pap tests should be repeated every 2 years.  Beginning at age 30, you should have a Pap test every 3 years as long as the past 3 Pap tests have been normal.  Some women have medical problems that increase the chance of getting cervical cancer. Talk to your caregiver about these problems. It is especially important to talk to your caregiver if a new problem develops soon after your last Pap test. In these cases, your caregiver may recommend more frequent screening and Pap tests.  The above recommendations are the same for women who have or have not gotten the vaccine for human papillomavirus (HPV).  If you had a hysterectomy for a problem that was not cancer or a condition that could lead to cancer, then you no longer need Pap tests. Even if you no longer need a Pap test, a regular exam is a good idea to make sure no other problems are  starting.  If you are between ages 65 and 70, and you have had normal Pap tests going back 10 years, you no longer need Pap tests. Even if you no longer need a Pap test, a regular exam is a good idea to make sure no other problems are starting.  If you have had past treatment for cervical cancer or a condition that could lead to cancer, you need Pap tests and screening for cancer for at least 20 years after your treatment.  If Pap tests have been discontinued, risk factors (such as a new sexual partner) need to be reassessed to determine if screening should be resumed.  The HPV test is an additional test that may be used for cervical cancer screening. The HPV test looks for the virus that can cause the cell changes on the cervix. The cells collected during the Pap test can be tested for HPV. The HPV test could be used to screen women aged 30 years and older, and should   be used in women of any age who have unclear Pap test results. After the age of 30, women should have HPV testing at the same frequency as a Pap test.  Colorectal cancer can be detected and often prevented. Most routine colorectal cancer screening begins at the age of 50 and continues through age 75. However, your caregiver may recommend screening at an earlier age if you have risk factors for colon cancer. On a yearly basis, your caregiver may provide home test kits to check for hidden blood in the stool. Use of a small camera at the end of a tube, to directly examine the colon (sigmoidoscopy or colonoscopy), can detect the earliest forms of colorectal cancer. Talk to your caregiver about this at age 50, when routine screening begins. Direct examination of the colon should be repeated every 5 to 10 years through age 75, unless early forms of pre-cancerous polyps or small growths are found.  Hepatitis C blood testing is recommended for all people born from 1945 through 1965 and any individual with known risks for hepatitis C.  Practice  safe sex. Use condoms and avoid high-risk sexual practices to reduce the spread of sexually transmitted infections (STIs). STIs include gonorrhea, chlamydia, syphilis, trichomonas, herpes, HPV, and human immunodeficiency virus (HIV). Herpes, HIV, and HPV are viral illnesses that have no cure. They can result in disability, cancer, and death. Sexually active women aged 25 and younger should be checked for chlamydia. Older women with new or multiple partners should also be tested for chlamydia. Testing for other STIs is recommended if you are sexually active and at increased risk.  Osteoporosis is a disease in which the bones lose minerals and strength with aging. This can result in serious bone fractures. The risk of osteoporosis can be identified using a bone density scan. Women ages 65 and over and women at risk for fractures or osteoporosis should discuss screening with their caregivers. Ask your caregiver whether you should take a calcium supplement or vitamin D to reduce the rate of osteoporosis.  Menopause can be associated with physical symptoms and risks. Hormone replacement therapy is available to decrease symptoms and risks. You should talk to your caregiver about whether hormone replacement therapy is right for you.  Use sunscreen with sun protection factor (SPF) of 30 or more. Apply sunscreen liberally and repeatedly throughout the day. You should seek shade when your shadow is shorter than you. Protect yourself by wearing long sleeves, pants, a wide-brimmed hat, and sunglasses year round, whenever you are outdoors.  Once a month, do a whole body skin exam, using a mirror to look at the skin on your back. Notify your caregiver of new moles, moles that have irregular borders, moles that are larger than a pencil eraser, or moles that have changed in shape or color.  Stay current with required immunizations.  Influenza. You need a dose every fall (or winter). The composition of the flu vaccine  changes each year, so being vaccinated once is not enough.  Pneumococcal polysaccharide. You need 1 to 2 doses if you smoke cigarettes or if you have certain chronic medical conditions. You need 1 dose at age 65 (or older) if you have never been vaccinated.  Tetanus, diphtheria, pertussis (Tdap, Td). Get 1 dose of Tdap vaccine if you are younger than age 65, are over 65 and have contact with an infant, are a healthcare worker, are pregnant, or simply want to be protected from whooping cough. After that, you need a Td   booster dose every 10 years. Consult your caregiver if you have not had at least 3 tetanus and diphtheria-containing shots sometime in your life or have a deep or dirty wound.  HPV. You need this vaccine if you are a woman age 26 or younger. The vaccine is given in 3 doses over 6 months.  Measles, mumps, rubella (MMR). You need at least 1 dose of MMR if you were born in 1957 or later. You may also need a second dose.  Meningococcal. If you are age 19 to 21 and a first-year college student living in a residence hall, or have one of several medical conditions, you need to get vaccinated against meningococcal disease. You may also need additional booster doses.  Zoster (shingles). If you are age 60 or older, you should get this vaccine.  Varicella (chickenpox). If you have never had chickenpox or you were vaccinated but received only 1 dose, talk to your caregiver to find out if you need this vaccine.  Hepatitis A. You need this vaccine if you have a specific risk factor for hepatitis A virus infection or you simply wish to be protected from this disease. The vaccine is usually given as 2 doses, 6 to 18 months apart.  Hepatitis B. You need this vaccine if you have a specific risk factor for hepatitis B virus infection or you simply wish to be protected from this disease. The vaccine is given in 3 doses, usually over 6 months. Preventive Services / Frequency Ages 19 to 39  Blood  pressure check.** / Every 1 to 2 years.  Lipid and cholesterol check.** / Every 5 years beginning at age 20.  Clinical breast exam.** / Every 3 years for women in their 20s and 30s.  Pap test.** / Every 2 years from ages 21 through 29. Every 3 years starting at age 30 through age 65 or 70 with a history of 3 consecutive normal Pap tests.  HPV screening.** / Every 3 years from ages 30 through ages 65 to 70 with a history of 3 consecutive normal Pap tests.  Hepatitis C blood test.** / For any individual with known risks for hepatitis C.  Skin self-exam. / Monthly.  Influenza immunization.** / Every year.  Pneumococcal polysaccharide immunization.** / 1 to 2 doses if you smoke cigarettes or if you have certain chronic medical conditions.  Tetanus, diphtheria, pertussis (Tdap, Td) immunization. / A one-time dose of Tdap vaccine. After that, you need a Td booster dose every 10 years.  HPV immunization. / 3 doses over 6 months, if you are 26 and younger.  Measles, mumps, rubella (MMR) immunization. / You need at least 1 dose of MMR if you were born in 1957 or later. You may also need a second dose.  Meningococcal immunization. / 1 dose if you are age 19 to 21 and a first-year college student living in a residence hall, or have one of several medical conditions, you need to get vaccinated against meningococcal disease. You may also need additional booster doses.  Varicella immunization.** / Consult your caregiver.  Hepatitis A immunization.** / Consult your caregiver. 2 doses, 6 to 18 months apart.  Hepatitis B immunization.** / Consult your caregiver. 3 doses usually over 6 months. Ages 40 to 64  Blood pressure check.** / Every 1 to 2 years.  Lipid and cholesterol check.** / Every 5 years beginning at age 20.  Clinical breast exam.** / Every year after age 40.  Mammogram.** / Every year beginning at age 40   and continuing for as long as you are in good health. Consult with your  caregiver.  Pap test.** / Every 3 years starting at age 30 through age 65 or 70 with a history of 3 consecutive normal Pap tests.  HPV screening.** / Every 3 years from ages 30 through ages 65 to 70 with a history of 3 consecutive normal Pap tests.  Fecal occult blood test (FOBT) of stool. / Every year beginning at age 50 and continuing until age 75. You may not need to do this test if you get a colonoscopy every 10 years.  Flexible sigmoidoscopy or colonoscopy.** / Every 5 years for a flexible sigmoidoscopy or every 10 years for a colonoscopy beginning at age 50 and continuing until age 75.  Hepatitis C blood test.** / For all people born from 1945 through 1965 and any individual with known risks for hepatitis C.  Skin self-exam. / Monthly.  Influenza immunization.** / Every year.  Pneumococcal polysaccharide immunization.** / 1 to 2 doses if you smoke cigarettes or if you have certain chronic medical conditions.  Tetanus, diphtheria, pertussis (Tdap, Td) immunization.** / A one-time dose of Tdap vaccine. After that, you need a Td booster dose every 10 years.  Measles, mumps, rubella (MMR) immunization. / You need at least 1 dose of MMR if you were born in 1957 or later. You may also need a second dose.  Varicella immunization.** / Consult your caregiver.  Meningococcal immunization.** / Consult your caregiver.  Hepatitis A immunization.** / Consult your caregiver. 2 doses, 6 to 18 months apart.  Hepatitis B immunization.** / Consult your caregiver. 3 doses, usually over 6 months. Ages 65 and over  Blood pressure check.** / Every 1 to 2 years.  Lipid and cholesterol check.** / Every 5 years beginning at age 20.  Clinical breast exam.** / Every year after age 40.  Mammogram.** / Every year beginning at age 40 and continuing for as long as you are in good health. Consult with your caregiver.  Pap test.** / Every 3 years starting at age 30 through age 65 or 70 with a 3  consecutive normal Pap tests. Testing can be stopped between 65 and 70 with 3 consecutive normal Pap tests and no abnormal Pap or HPV tests in the past 10 years.  HPV screening.** / Every 3 years from ages 30 through ages 65 or 70 with a history of 3 consecutive normal Pap tests. Testing can be stopped between 65 and 70 with 3 consecutive normal Pap tests and no abnormal Pap or HPV tests in the past 10 years.  Fecal occult blood test (FOBT) of stool. / Every year beginning at age 50 and continuing until age 75. You may not need to do this test if you get a colonoscopy every 10 years.  Flexible sigmoidoscopy or colonoscopy.** / Every 5 years for a flexible sigmoidoscopy or every 10 years for a colonoscopy beginning at age 50 and continuing until age 75.  Hepatitis C blood test.** / For all people born from 1945 through 1965 and any individual with known risks for hepatitis C.  Osteoporosis screening.** / A one-time screening for women ages 65 and over and women at risk for fractures or osteoporosis.  Skin self-exam. / Monthly.  Influenza immunization.** / Every year.  Pneumococcal polysaccharide immunization.** / 1 dose at age 65 (or older) if you have never been vaccinated.  Tetanus, diphtheria, pertussis (Tdap, Td) immunization. / A one-time dose of Tdap vaccine if you are over   65 and have contact with an infant, are a healthcare worker, or simply want to be protected from whooping cough. After that, you need a Td booster dose every 10 years.  Varicella immunization.** / Consult your caregiver.  Meningococcal immunization.** / Consult your caregiver.  Hepatitis A immunization.** / Consult your caregiver. 2 doses, 6 to 18 months apart.  Hepatitis B immunization.** / Check with your caregiver. 3 doses, usually over 6 months. ** Family history and personal history of risk and conditions may change your caregiver's recommendations. Document Released: 03/27/2001 Document Revised: 04/23/2011  Document Reviewed: 06/26/2010 [employer] Patient Information 2014 [employer].  

## 2012-10-29 NOTE — Addendum Note (Signed)
Addended by: Barbara Cower on: 10/29/2012 10:11 AM   Modules accepted: Orders

## 2012-11-04 LAB — PAP LB, RFX HPV ALL PTH

## 2013-03-04 ENCOUNTER — Telehealth: Payer: Self-pay | Admitting: *Deleted

## 2013-03-04 DIAGNOSIS — Z01419 Encounter for gynecological examination (general) (routine) without abnormal findings: Secondary | ICD-10-CM

## 2013-03-04 DIAGNOSIS — IMO0001 Reserved for inherently not codable concepts without codable children: Secondary | ICD-10-CM

## 2013-03-04 MED ORDER — ETONOGESTREL-ETHINYL ESTRADIOL 0.12-0.015 MG/24HR VA RING: VAGINAL_RING | VAGINAL | Status: DC

## 2013-03-04 NOTE — Telephone Encounter (Signed)
Patient needs refill of her nuva ring sent to her mail order company.

## 2013-12-14 ENCOUNTER — Encounter: Payer: Self-pay | Admitting: Obstetrics and Gynecology

## 2014-05-19 ENCOUNTER — Encounter: Payer: Self-pay | Admitting: Family Medicine

## 2014-05-19 ENCOUNTER — Ambulatory Visit (INDEPENDENT_AMBULATORY_CARE_PROVIDER_SITE_OTHER): Payer: 59 | Admitting: Family Medicine

## 2014-05-19 VITALS — BP 142/86 | HR 73 | Ht 65.0 in | Wt 177.2 lb

## 2014-05-19 DIAGNOSIS — Z3049 Encounter for surveillance of other contraceptives: Secondary | ICD-10-CM

## 2014-05-19 DIAGNOSIS — E669 Obesity, unspecified: Secondary | ICD-10-CM

## 2014-05-19 DIAGNOSIS — Z01419 Encounter for gynecological examination (general) (routine) without abnormal findings: Secondary | ICD-10-CM

## 2014-05-19 DIAGNOSIS — Z124 Encounter for screening for malignant neoplasm of cervix: Secondary | ICD-10-CM | POA: Diagnosis not present

## 2014-05-19 MED ORDER — PHENTERMINE HCL 37.5 MG PO CAPS: 37.5000 mg | ORAL_CAPSULE | ORAL | Status: DC

## 2014-05-19 MED ORDER — ETONOGESTREL-ETHINYL ESTRADIOL 0.12-0.015 MG/24HR VA RING: VAGINAL_RING | VAGINAL | Status: DC

## 2014-05-19 NOTE — Patient Instructions (Signed)
Preventive Care for Adults A healthy lifestyle and preventive care can promote health and wellness. Preventive health guidelines for women include the following key practices.  A routine yearly physical is a good way to check with your health care provider about your health and preventive screening. It is a chance to share any concerns and updates on your health and to receive a thorough exam.  Visit your dentist for a routine exam and preventive care every 6 months. Brush your teeth twice a day and floss once a day. Good oral hygiene prevents tooth decay and gum disease.  The frequency of eye exams is based on your age, health, family medical history, use of contact lenses, and other factors. Follow your health care provider's recommendations for frequency of eye exams.  Eat a healthy diet. Foods like vegetables, fruits, whole grains, low-fat dairy products, and lean protein foods contain the nutrients you need without too many calories. Decrease your intake of foods high in solid fats, added sugars, and salt. Eat the right amount of calories for you.Get information about a proper diet from your health care provider, if necessary.  Regular physical exercise is one of the most important things you can do for your health. Most adults should get at least 150 minutes of moderate-intensity exercise (any activity that increases your heart rate and causes you to sweat) each week. In addition, most adults need muscle-strengthening exercises on 2 or more days a week.  Maintain a healthy weight. The body mass index (BMI) is a screening tool to identify possible weight problems. It provides an estimate of body fat based on height and weight. Your health care provider can find your BMI and can help you achieve or maintain a healthy weight.For adults 20 years and older:  A BMI below 18.5 is considered underweight.  A BMI of 18.5 to 24.9 is normal.  A BMI of 25 to 29.9 is considered overweight.  A BMI of  30 and above is considered obese.  Maintain normal blood lipids and cholesterol levels by exercising and minimizing your intake of saturated fat. Eat a balanced diet with plenty of fruit and vegetables. Blood tests for lipids and cholesterol should begin at age 76 and be repeated every 5 years. If your lipid or cholesterol levels are high, you are over 50, or you are at high risk for heart disease, you may need your cholesterol levels checked more frequently.Ongoing high lipid and cholesterol levels should be treated with medicines if diet and exercise are not working.  If you smoke, find out from your health care provider how to quit. If you do not use tobacco, do not start.  Lung cancer screening is recommended for adults aged 22-80 years who are at high risk for developing lung cancer because of a history of smoking. A yearly low-dose CT scan of the lungs is recommended for people who have at least a 30-pack-year history of smoking and are a current smoker or have quit within the past 15 years. A pack year of smoking is smoking an average of 1 pack of cigarettes a day for 1 year (for example: 1 pack a day for 30 years or 2 packs a day for 15 years). Yearly screening should continue until the smoker has stopped smoking for at least 15 years. Yearly screening should be stopped for people who develop a health problem that would prevent them from having lung cancer treatment.  If you are pregnant, do not drink alcohol. If you are breastfeeding,  be very cautious about drinking alcohol. If you are not pregnant and choose to drink alcohol, do not have more than 1 drink per day. One drink is considered to be 12 ounces (355 mL) of beer, 5 ounces (148 mL) of wine, or 1.5 ounces (44 mL) of liquor.  Avoid use of street drugs. Do not share needles with anyone. Ask for help if you need support or instructions about stopping the use of drugs.  High blood pressure causes heart disease and increases the risk of  stroke. Your blood pressure should be checked at least every 1 to 2 years. Ongoing high blood pressure should be treated with medicines if weight loss and exercise do not work.  If you are 75-52 years old, ask your health care provider if you should take aspirin to prevent strokes.  Diabetes screening involves taking a blood sample to check your fasting blood sugar level. This should be done once every 3 years, after age 15, if you are within normal weight and without risk factors for diabetes. Testing should be considered at a younger age or be carried out more frequently if you are overweight and have at least 1 risk factor for diabetes.  Breast cancer screening is essential preventive care for women. You should practice "breast self-awareness." This means understanding the normal appearance and feel of your breasts and may include breast self-examination. Any changes detected, no matter how small, should be reported to a health care provider. Women in their 58s and 30s should have a clinical breast exam (CBE) by a health care provider as part of a regular health exam every 1 to 3 years. After age 16, women should have a CBE every year. Starting at age 53, women should consider having a mammogram (breast X-ray test) every year. Women who have a family history of breast cancer should talk to their health care provider about genetic screening. Women at a high risk of breast cancer should talk to their health care providers about having an MRI and a mammogram every year.  Breast cancer gene (BRCA)-related cancer risk assessment is recommended for women who have family members with BRCA-related cancers. BRCA-related cancers include breast, ovarian, tubal, and peritoneal cancers. Having family members with these cancers may be associated with an increased risk for harmful changes (mutations) in the breast cancer genes BRCA1 and BRCA2. Results of the assessment will determine the need for genetic counseling and  BRCA1 and BRCA2 testing.  Routine pelvic exams to screen for cancer are no longer recommended for nonpregnant women who are considered low risk for cancer of the pelvic organs (ovaries, uterus, and vagina) and who do not have symptoms. Ask your health care provider if a screening pelvic exam is right for you.  If you have had past treatment for cervical cancer or a condition that could lead to cancer, you need Pap tests and screening for cancer for at least 20 years after your treatment. If Pap tests have been discontinued, your risk factors (such as having a new sexual partner) need to be reassessed to determine if screening should be resumed. Some women have medical problems that increase the chance of getting cervical cancer. In these cases, your health care provider may recommend more frequent screening and Pap tests.  The HPV test is an additional test that may be used for cervical cancer screening. The HPV test looks for the virus that can cause the cell changes on the cervix. The cells collected during the Pap test can be  tested for HPV. The HPV test could be used to screen women aged 30 years and older, and should be used in women of any age who have unclear Pap test results. After the age of 30, women should have HPV testing at the same frequency as a Pap test.  Colorectal cancer can be detected and often prevented. Most routine colorectal cancer screening begins at the age of 50 years and continues through age 75 years. However, your health care provider may recommend screening at an earlier age if you have risk factors for colon cancer. On a yearly basis, your health care provider may provide home test kits to check for hidden blood in the stool. Use of a small camera at the end of a tube, to directly examine the colon (sigmoidoscopy or colonoscopy), can detect the earliest forms of colorectal cancer. Talk to your health care provider about this at age 50, when routine screening begins. Direct  exam of the colon should be repeated every 5-10 years through age 75 years, unless early forms of pre-cancerous polyps or small growths are found.  People who are at an increased risk for hepatitis B should be screened for this virus. You are considered at high risk for hepatitis B if:  You were born in a country where hepatitis B occurs often. Talk with your health care provider about which countries are considered high risk.  Your parents were born in a high-risk country and you have not received a shot to protect against hepatitis B (hepatitis B vaccine).  You have HIV or AIDS.  You use needles to inject street drugs.  You live with, or have sex with, someone who has hepatitis B.  You get hemodialysis treatment.  You take certain medicines for conditions like cancer, organ transplantation, and autoimmune conditions.  Hepatitis C blood testing is recommended for all people born from 1945 through 1965 and any individual with known risks for hepatitis C.  Practice safe sex. Use condoms and avoid high-risk sexual practices to reduce the spread of sexually transmitted infections (STIs). STIs include gonorrhea, chlamydia, syphilis, trichomonas, herpes, HPV, and human immunodeficiency virus (HIV). Herpes, HIV, and HPV are viral illnesses that have no cure. They can result in disability, cancer, and death.  You should be screened for sexually transmitted illnesses (STIs) including gonorrhea and chlamydia if:  You are sexually active and are younger than 24 years.  You are older than 24 years and your health care provider tells you that you are at risk for this type of infection.  Your sexual activity has changed since you were last screened and you are at an increased risk for chlamydia or gonorrhea. Ask your health care provider if you are at risk.  If you are at risk of being infected with HIV, it is recommended that you take a prescription medicine daily to prevent HIV infection. This is  called preexposure prophylaxis (PrEP). You are considered at risk if:  You are a heterosexual woman, are sexually active, and are at increased risk for HIV infection.  You take drugs by injection.  You are sexually active with a partner who has HIV.  Talk with your health care provider about whether you are at high risk of being infected with HIV. If you choose to begin PrEP, you should first be tested for HIV. You should then be tested every 3 months for as long as you are taking PrEP.  Osteoporosis is a disease in which the bones lose minerals and strength   with aging. This can result in serious bone fractures or breaks. The risk of osteoporosis can be identified using a bone density scan. Women ages 65 years and over and women at risk for fractures or osteoporosis should discuss screening with their health care providers. Ask your health care provider whether you should take a calcium supplement or vitamin D to reduce the rate of osteoporosis.  Menopause can be associated with physical symptoms and risks. Hormone replacement therapy is available to decrease symptoms and risks. You should talk to your health care provider about whether hormone replacement therapy is right for you.  Use sunscreen. Apply sunscreen liberally and repeatedly throughout the day. You should seek shade when your shadow is shorter than you. Protect yourself by wearing long sleeves, pants, a wide-brimmed hat, and sunglasses year round, whenever you are outdoors.  Once a month, do a whole body skin exam, using a mirror to look at the skin on your back. Tell your health care provider of new moles, moles that have irregular borders, moles that are larger than a pencil eraser, or moles that have changed in shape or color.  Stay current with required vaccines (immunizations).  Influenza vaccine. All adults should be immunized every year.  Tetanus, diphtheria, and acellular pertussis (Td, Tdap) vaccine. Pregnant women should  receive 1 dose of Tdap vaccine during each pregnancy. The dose should be obtained regardless of the length of time since the last dose. Immunization is preferred during the 27th-36th week of gestation. An adult who has not previously received Tdap or who does not know her vaccine status should receive 1 dose of Tdap. This initial dose should be followed by tetanus and diphtheria toxoids (Td) booster doses every 10 years. Adults with an unknown or incomplete history of completing a 3-dose immunization series with Td-containing vaccines should begin or complete a primary immunization series including a Tdap dose. Adults should receive a Td booster every 10 years.  Varicella vaccine. An adult without evidence of immunity to varicella should receive 2 doses or a second dose if she has previously received 1 dose. Pregnant females who do not have evidence of immunity should receive the first dose after pregnancy. This first dose should be obtained before leaving the health care facility. The second dose should be obtained 4-8 weeks after the first dose.  Human papillomavirus (HPV) vaccine. Females aged 13-26 years who have not received the vaccine previously should obtain the 3-dose series. The vaccine is not recommended for use in pregnant females. However, pregnancy testing is not needed before receiving a dose. If a female is found to be pregnant after receiving a dose, no treatment is needed. In that case, the remaining doses should be delayed until after the pregnancy. Immunization is recommended for any person with an immunocompromised condition through the age of 26 years if she did not get any or all doses earlier. During the 3-dose series, the second dose should be obtained 4-8 weeks after the first dose. The third dose should be obtained 24 weeks after the first dose and 16 weeks after the second dose.  Zoster vaccine. One dose is recommended for adults aged 60 years or older unless certain conditions are  present.  Measles, mumps, and rubella (MMR) vaccine. Adults born before 1957 generally are considered immune to measles and mumps. Adults born in 1957 or later should have 1 or more doses of MMR vaccine unless there is a contraindication to the vaccine or there is laboratory evidence of immunity to   each of the three diseases. A routine second dose of MMR vaccine should be obtained at least 28 days after the first dose for students attending postsecondary schools, health care workers, or international travelers. People who received inactivated measles vaccine or an unknown type of measles vaccine during 1963-1967 should receive 2 doses of MMR vaccine. People who received inactivated mumps vaccine or an unknown type of mumps vaccine before 1979 and are at high risk for mumps infection should consider immunization with 2 doses of MMR vaccine. For females of childbearing age, rubella immunity should be determined. If there is no evidence of immunity, females who are not pregnant should be vaccinated. If there is no evidence of immunity, females who are pregnant should delay immunization until after pregnancy. Unvaccinated health care workers born before 1957 who lack laboratory evidence of measles, mumps, or rubella immunity or laboratory confirmation of disease should consider measles and mumps immunization with 2 doses of MMR vaccine or rubella immunization with 1 dose of MMR vaccine.  Pneumococcal 13-valent conjugate (PCV13) vaccine. When indicated, a person who is uncertain of her immunization history and has no record of immunization should receive the PCV13 vaccine. An adult aged 19 years or older who has certain medical conditions and has not been previously immunized should receive 1 dose of PCV13 vaccine. This PCV13 should be followed with a dose of pneumococcal polysaccharide (PPSV23) vaccine. The PPSV23 vaccine dose should be obtained at least 8 weeks after the dose of PCV13 vaccine. An adult aged 19  years or older who has certain medical conditions and previously received 1 or more doses of PPSV23 vaccine should receive 1 dose of PCV13. The PCV13 vaccine dose should be obtained 1 or more years after the last PPSV23 vaccine dose.  Pneumococcal polysaccharide (PPSV23) vaccine. When PCV13 is also indicated, PCV13 should be obtained first. All adults aged 65 years and older should be immunized. An adult younger than age 65 years who has certain medical conditions should be immunized. Any person who resides in a nursing home or long-term care facility should be immunized. An adult smoker should be immunized. People with an immunocompromised condition and certain other conditions should receive both PCV13 and PPSV23 vaccines. People with human immunodeficiency virus (HIV) infection should be immunized as soon as possible after diagnosis. Immunization during chemotherapy or radiation therapy should be avoided. Routine use of PPSV23 vaccine is not recommended for American Indians, [place] Natives, or people younger than 65 years unless there are medical conditions that require PPSV23 vaccine. When indicated, people who have unknown immunization and have no record of immunization should receive PPSV23 vaccine. One-time revaccination 5 years after the first dose of PPSV23 is recommended for people aged 19-64 years who have chronic kidney failure, nephrotic syndrome, asplenia, or immunocompromised conditions. People who received 1-2 doses of PPSV23 before age 65 years should receive another dose of PPSV23 vaccine at age 65 years or later if at least 5 years have passed since the previous dose. Doses of PPSV23 are not needed for people immunized with PPSV23 at or after age 65 years.  Meningococcal vaccine. Adults with asplenia or persistent complement component deficiencies should receive 2 doses of quadrivalent meningococcal conjugate (MenACWY-D) vaccine. The doses should be obtained at least 2 months apart.  Microbiologists working with certain meningococcal bacteria, military recruits, people at risk during an outbreak, and people who travel to or live in countries with a high rate of meningitis should be immunized. A first-year college student up through age   21 years who is living in a residence hall should receive a dose if she did not receive a dose on or after her 16th birthday. Adults who have certain high-risk conditions should receive one or more doses of vaccine.  Hepatitis A vaccine. Adults who wish to be protected from this disease, have certain high-risk conditions, work with hepatitis A-infected animals, work in hepatitis A research labs, or travel to or work in countries with a high rate of hepatitis A should be immunized. Adults who were previously unvaccinated and who anticipate close contact with an international adoptee during the first 60 days after arrival in the [place] from a country with a high rate of hepatitis A should be immunized.  Hepatitis B vaccine. Adults who wish to be protected from this disease, have certain high-risk conditions, may be exposed to blood or other infectious body fluids, are household contacts or sex partners of hepatitis B positive people, are clients or workers in certain care facilities, or travel to or work in countries with a high rate of hepatitis B should be immunized.  Haemophilus influenzae type b (Hib) vaccine. A previously unvaccinated person with asplenia or sickle cell disease or having a scheduled splenectomy should receive 1 dose of Hib vaccine. Regardless of previous immunization, a recipient of a hematopoietic stem cell transplant should receive a 3-dose series 6-12 months after her successful transplant. Hib vaccine is not recommended for adults with HIV infection. Preventive Services / Frequency Ages 64 to 68 years  Blood pressure check.** / Every 1 to 2 years.  Lipid and cholesterol check.** / Every 5 years beginning at age  22.  Clinical breast exam.** / Every 3 years for women in their 88s and 53s.  BRCA-related cancer risk assessment.** / For women who have family members with a BRCA-related cancer (breast, ovarian, tubal, or peritoneal cancers).  Pap test.** / Every 2 years from ages 90 through 51. Every 3 years starting at age 21 through age 56 or 3 with a history of 3 consecutive normal Pap tests.  HPV screening.** / Every 3 years from ages 24 through ages 1 to 46 with a history of 3 consecutive normal Pap tests.  Hepatitis C blood test.** / For any individual with known risks for hepatitis C.  Skin self-exam. / Monthly.  Influenza vaccine. / Every year.  Tetanus, diphtheria, and acellular pertussis (Tdap, Td) vaccine.** / Consult your health care provider. Pregnant women should receive 1 dose of Tdap vaccine during each pregnancy. 1 dose of Td every 10 years.  Varicella vaccine.** / Consult your health care provider. Pregnant females who do not have evidence of immunity should receive the first dose after pregnancy.  HPV vaccine. / 3 doses over 6 months, if 72 and younger. The vaccine is not recommended for use in pregnant females. However, pregnancy testing is not needed before receiving a dose.  Measles, mumps, rubella (MMR) vaccine.** / You need at least 1 dose of MMR if you were born in 1957 or later. You may also need a 2nd dose. For females of childbearing age, rubella immunity should be determined. If there is no evidence of immunity, females who are not pregnant should be vaccinated. If there is no evidence of immunity, females who are pregnant should delay immunization until after pregnancy.  Pneumococcal 13-valent conjugate (PCV13) vaccine.** / Consult your health care provider.  Pneumococcal polysaccharide (PPSV23) vaccine.** / 1 to 2 doses if you smoke cigarettes or if you have certain conditions.  Meningococcal vaccine.** /  1 dose if you are age 19 to 21 years and a first-year college  student living in a residence hall, or have one of several medical conditions, you need to get vaccinated against meningococcal disease. You may also need additional booster doses.  Hepatitis A vaccine.** / Consult your health care provider.  Hepatitis B vaccine.** / Consult your health care provider.  Haemophilus influenzae type b (Hib) vaccine.** / Consult your health care provider. Ages 40 to 64 years  Blood pressure check.** / Every 1 to 2 years.  Lipid and cholesterol check.** / Every 5 years beginning at age 20 years.  Lung cancer screening. / Every year if you are aged 55-80 years and have a 30-pack-year history of smoking and currently smoke or have quit within the past 15 years. Yearly screening is stopped once you have quit smoking for at least 15 years or develop a health problem that would prevent you from having lung cancer treatment.  Clinical breast exam.** / Every year after age 40 years.  BRCA-related cancer risk assessment.** / For women who have family members with a BRCA-related cancer (breast, ovarian, tubal, or peritoneal cancers).  Mammogram.** / Every year beginning at age 40 years and continuing for as long as you are in good health. Consult with your health care provider.  Pap test.** / Every 3 years starting at age 30 years through age 65 or 70 years with a history of 3 consecutive normal Pap tests.  HPV screening.** / Every 3 years from ages 30 years through ages 65 to 70 years with a history of 3 consecutive normal Pap tests.  Fecal occult blood test (FOBT) of stool. / Every year beginning at age 50 years and continuing until age 75 years. You may not need to do this test if you get a colonoscopy every 10 years.  Flexible sigmoidoscopy or colonoscopy.** / Every 5 years for a flexible sigmoidoscopy or every 10 years for a colonoscopy beginning at age 50 years and continuing until age 75 years.  Hepatitis C blood test.** / For all people born from 1945 through  1965 and any individual with known risks for hepatitis C.  Skin self-exam. / Monthly.  Influenza vaccine. / Every year.  Tetanus, diphtheria, and acellular pertussis (Tdap/Td) vaccine.** / Consult your health care provider. Pregnant women should receive 1 dose of Tdap vaccine during each pregnancy. 1 dose of Td every 10 years.  Varicella vaccine.** / Consult your health care provider. Pregnant females who do not have evidence of immunity should receive the first dose after pregnancy.  Zoster vaccine.** / 1 dose for adults aged 60 years or older.  Measles, mumps, rubella (MMR) vaccine.** / You need at least 1 dose of MMR if you were born in 1957 or later. You may also need a 2nd dose. For females of childbearing age, rubella immunity should be determined. If there is no evidence of immunity, females who are not pregnant should be vaccinated. If there is no evidence of immunity, females who are pregnant should delay immunization until after pregnancy.  Pneumococcal 13-valent conjugate (PCV13) vaccine.** / Consult your health care provider.  Pneumococcal polysaccharide (PPSV23) vaccine.** / 1 to 2 doses if you smoke cigarettes or if you have certain conditions.  Meningococcal vaccine.** / Consult your health care provider.  Hepatitis A vaccine.** / Consult your health care provider.  Hepatitis B vaccine.** / Consult your health care provider.  Haemophilus influenzae type b (Hib) vaccine.** / Consult your health care provider. Ages 65   years and over  Blood pressure check.** / Every 1 to 2 years.  Lipid and cholesterol check.** / Every 5 years beginning at age 22 years.  Lung cancer screening. / Every year if you are aged 73-80 years and have a 30-pack-year history of smoking and currently smoke or have quit within the past 15 years. Yearly screening is stopped once you have quit smoking for at least 15 years or develop a health problem that would prevent you from having lung cancer  treatment.  Clinical breast exam.** / Every year after age 4 years.  BRCA-related cancer risk assessment.** / For women who have family members with a BRCA-related cancer (breast, ovarian, tubal, or peritoneal cancers).  Mammogram.** / Every year beginning at age 40 years and continuing for as long as you are in good health. Consult with your health care provider.  Pap test.** / Every 3 years starting at age 9 years through age 34 or 91 years with 3 consecutive normal Pap tests. Testing can be stopped between 65 and 70 years with 3 consecutive normal Pap tests and no abnormal Pap or HPV tests in the past 10 years.  HPV screening.** / Every 3 years from ages 57 years through ages 64 or 45 years with a history of 3 consecutive normal Pap tests. Testing can be stopped between 65 and 70 years with 3 consecutive normal Pap tests and no abnormal Pap or HPV tests in the past 10 years.  Fecal occult blood test (FOBT) of stool. / Every year beginning at age 15 years and continuing until age 17 years. You may not need to do this test if you get a colonoscopy every 10 years.  Flexible sigmoidoscopy or colonoscopy.** / Every 5 years for a flexible sigmoidoscopy or every 10 years for a colonoscopy beginning at age 86 years and continuing until age 71 years.  Hepatitis C blood test.** / For all people born from [birth year] through 1963 and any individual with known risks for hepatitis C.  Osteoporosis screening.** / A one-time screening for women ages 83 years and over and women at risk for fractures or osteoporosis.  Skin self-exam. / Monthly.  Influenza vaccine. / Every year.  Tetanus, diphtheria, and acellular pertussis (Tdap/Td) vaccine.** / 1 dose of Td every 10 years.  Varicella vaccine.** / Consult your health care provider.  Zoster vaccine.** / 1 dose for adults aged 61 years or older.  Pneumococcal 13-valent conjugate (PCV13) vaccine.** / Consult your health care provider.  Pneumococcal  polysaccharide (PPSV23) vaccine.** / 1 dose for all adults aged 28 years and older.  Meningococcal vaccine.** / Consult your health care provider.  Hepatitis A vaccine.** / Consult your health care provider.  Hepatitis B vaccine.** / Consult your health care provider.  Haemophilus influenzae type b (Hib) vaccine.** / Consult your health care provider. ** Family history and personal history of risk and conditions may change your health care provider's recommendations. Document Released: 03/27/2001 Document Revised: 06/15/2013 Document Reviewed: 06/26/2010 [facility] Patient Information 2015 [place], [place]. This information is not intended to replace advice given to you by your health care provider. Make sure you discuss any questions you have with your health care provider.

## 2014-05-19 NOTE — Progress Notes (Signed)
  Subjective:     [name] is a 49 y.o. female and is here for a comprehensive physical exam. The patient reports problems - needs to lose weight. Notes no issues with her Nuva Ring.  History   Social History  . Marital Status: Married    Spouse Name: N/A  . Number of Children: N/A  . Years of Education: N/A   Occupational History  . Not on file.   Social History Main Topics  . Smoking status: Never Smoker   . Smokeless tobacco: Never Used  . Alcohol Use: No  . Drug Use: No  . Sexual Activity:    Partners: Male    Birth Control/ Protection: Inserts     Comment: nuva ring   Other Topics Concern  . Not on file   Social History Narrative   Health Maintenance  Topic Date Due  . HIV Screening  01/08/1983  . TETANUS/TDAP  01/08/1987  . INFLUENZA VACCINE  09/13/2014  . PAP SMEAR  10/30/2015    The following portions of the patient's history were reviewed and updated as appropriate: allergies, current medications, past family history, past medical history, past social history, past surgical history and problem list.  Review of Systems A comprehensive review of systems was negative.   Objective:    BP 142/86 mmHg  Pulse 73  Ht 5\' 5"  (1.651 m)  Wt 177 lb 3.2 oz (80.377 kg)  BMI 29.49 kg/m2  LMP 04/29/2014 (Exact Date) General appearance: alert, cooperative and appears stated age Head: Normocephalic, without obvious abnormality, atraumatic Neck: no adenopathy, supple, symmetrical, trachea midline and thyroid not enlarged, symmetric, no tenderness/mass/nodules Lungs: clear to auscultation bilaterally Breasts: normal appearance, no masses or tenderness Heart: regular rate and rhythm, S1, S2 normal, no murmur, click, rub or gallop Abdomen: soft, non-tender; bowel sounds normal; no masses,  no organomegaly Pelvic: cervix normal in appearance, external genitalia normal, no adnexal masses or tenderness, no cervical motion tenderness, uterus normal size, shape, and  consistency, vagina normal without discharge and nuvaring is in place Extremities: extremities normal, atraumatic, no cyanosis or edema Pulses: 2+ and symmetric Skin: Skin color, texture, turgor normal. No rashes or lesions Lymph nodes: Cervical, supraclavicular, and axillary nodes normal. Neurologic: Grossly normal    Assessment:    Healthy female exam.      Plan:   Problem List Items Addressed This Visit    None    Visit Diagnoses    Encounter for routine gynecological examination    -  Primary    Relevant Medications    etonogestrel-ethinyl estradiol (NUVARING) 0.12-0.015 MG/24HR vaginal ring    Other Relevant Orders    Follicle stimulating hormone    Pap Lb, rfx HPV all pth    MM DIGITAL SCREENING BILATERAL    Screening for malignant neoplasm of cervix        Relevant Orders    Pap Lb, rfx HPV all pth    Encounter for surveillance of other contraceptive [Z30.49]        Relevant Medications    etonogestrel-ethinyl estradiol (NUVARING) 0.12-0.015 MG/24HR vaginal ring    Obesity        Relevant Medications    phentermine capsule       Other labs done through [facility]   See After Visit Summary for Counseling Recommendations

## 2014-05-19 NOTE — Progress Notes (Signed)
Would like to discuss weight loss.

## 2014-05-20 LAB — FOLLICLE STIMULATING HORMONE: FSH: 2.1 m[IU]/mL

## 2014-05-20 MED ORDER — PHENTERMINE HCL 37.5 MG PO TABS: 37.5000 mg | ORAL_TABLET | Freq: Every day | ORAL | Status: AC

## 2014-05-20 NOTE — Addendum Note (Signed)
Addended by: Barbara CowerNOGUES, Electa Sterry L on: 05/20/2014 02:43 PM   Modules accepted: Orders

## 2014-05-22 LAB — PAP LB, RFX HPV ALL PTH: PAP SMEAR COMMENT: 0

## 2014-05-31 ENCOUNTER — Ambulatory Visit: Payer: Self-pay | Admitting: Nurse Practitioner

## 2015-06-14 ENCOUNTER — Other Ambulatory Visit: Payer: Self-pay | Admitting: Family Medicine

## 2015-06-17 ENCOUNTER — Ambulatory Visit: Payer: 59 | Admitting: Family Medicine

## 2015-07-12 ENCOUNTER — Ambulatory Visit: Payer: 59 | Admitting: Family Medicine

## 2016-04-23 ENCOUNTER — Other Ambulatory Visit: Payer: Self-pay | Admitting: Family Medicine

## 2017-02-28 ENCOUNTER — Other Ambulatory Visit: Payer: Self-pay | Admitting: Orthopedic Surgery

## 2017-02-28 DIAGNOSIS — M25512 Pain in left shoulder: Secondary | ICD-10-CM

## 2017-03-01 ENCOUNTER — Other Ambulatory Visit: Payer: Self-pay | Admitting: Family Medicine

## 2017-03-13 ENCOUNTER — Ambulatory Visit
Admission: RE | Admit: 2017-03-13 | Discharge: 2017-03-13 | Disposition: A | Discharge status: Home / Self Care | Payer: Managed Care, Other (non HMO) | Source: Ambulatory Visit | Attending: Orthopedic Surgery | Admitting: Orthopedic Surgery

## 2017-03-13 DIAGNOSIS — M25412 Effusion, left shoulder: Secondary | ICD-10-CM | POA: Insufficient documentation

## 2017-03-13 DIAGNOSIS — M25512 Pain in left shoulder: Secondary | ICD-10-CM | POA: Diagnosis present

## 2017-12-14 ENCOUNTER — Other Ambulatory Visit: Payer: Self-pay | Admitting: Family Medicine

## 2018-10-26 ENCOUNTER — Other Ambulatory Visit: Payer: Self-pay | Admitting: Family Medicine

## 2018-11-11 ENCOUNTER — Other Ambulatory Visit: Payer: Self-pay | Admitting: Family Medicine

## 2019-01-12 IMAGING — MR MR SHOULDER*L* W/O CM
5 series · 40 of 40 positions shown · non-contrast
Comparison: None.

CLINICAL DATA: Left shoulder pain with painful range of motion.

EXAM:
MRI OF THE LEFT SHOULDER WITHOUT CONTRAST
TECHNIQUE: Multiplanar, multisequence MR imaging of the shoulder was performed.
No intravenous contrast was administered.

[Series 3: T2 fat-sat · axial · 4.0mm · 0.47mm/px · z∈[-47,+50]mm · 10 of 23 slices shown (1 of 3)]
[im 1/23]
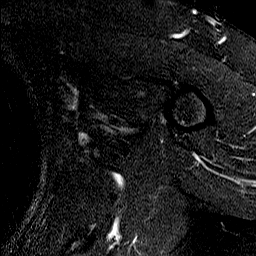
[im 3/23]
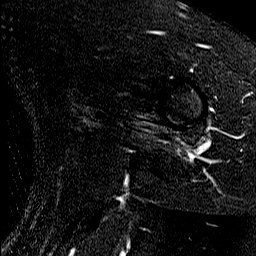
[im 5/23]
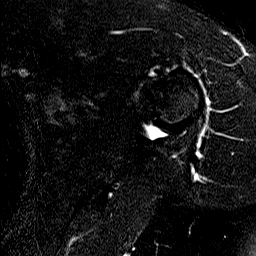
[im 8/23]
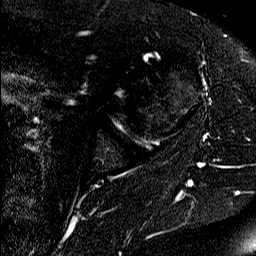
[im 10/23]
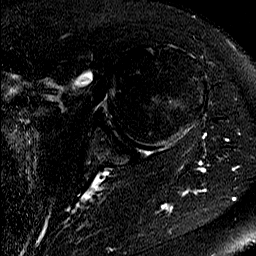
[im 13/23]
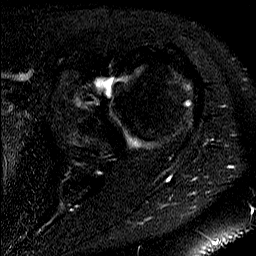
[im 15/23]
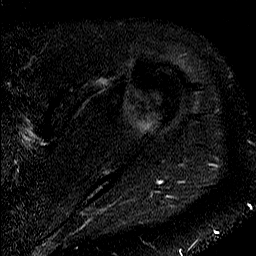
[im 18/23]
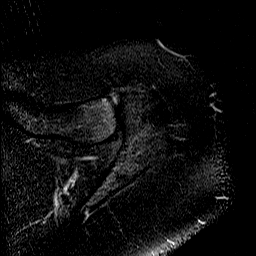
[im 20/23]
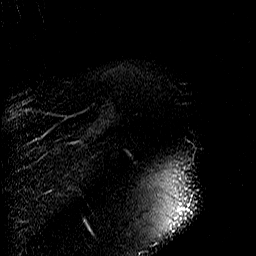
[im 23/23]
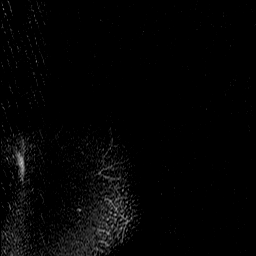

[Series 4: T2 fat-sat · oblique · 4.0mm · 0.62mm/px · 7 of 19 slices shown (2 of 3)]
[im 1/19]
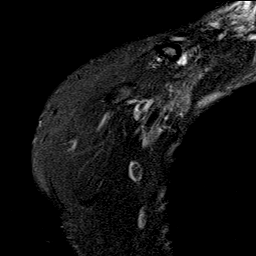
[im 4/19]
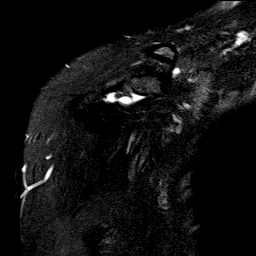
[im 7/19]
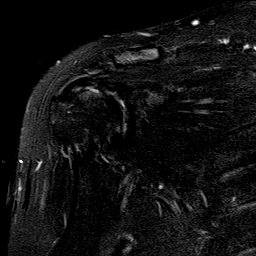
[im 10/19]
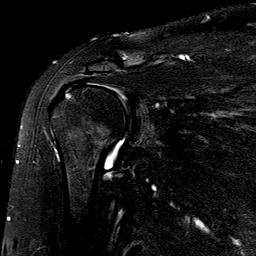
[im 13/19]
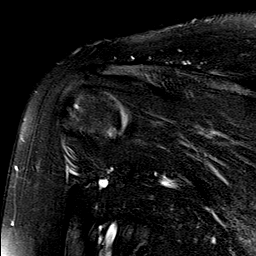
[im 16/19]
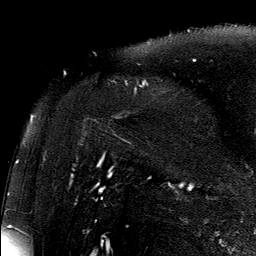
[im 19/19]
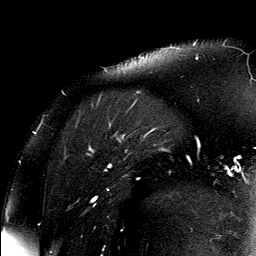

[Series 5: PD · oblique · 4.0mm · 0.62mm/px · 7 of 19 slices shown]
[im 1/19]
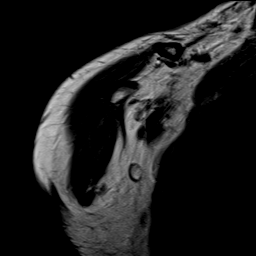
[im 4/19]
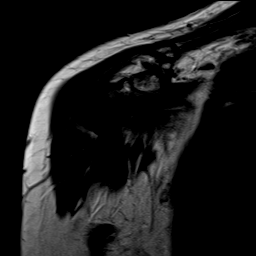
[im 7/19]
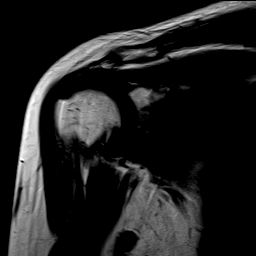
[im 10/19]
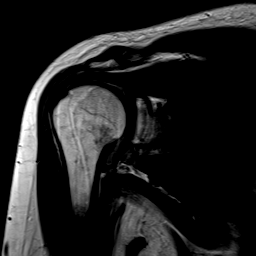
[im 13/19]
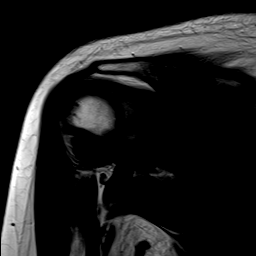
[im 16/19]
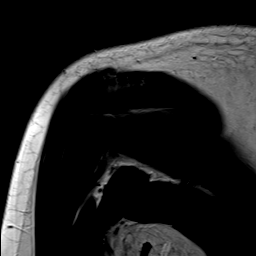
[im 19/19]
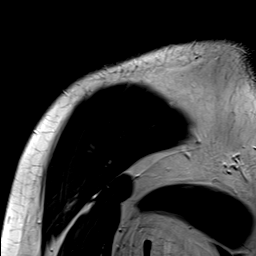

[Series 6: T1 · oblique · 4.0mm · 0.62mm/px · 8 of 21 slices shown]
[im 1/21]
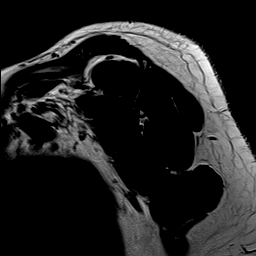
[im 3/21]
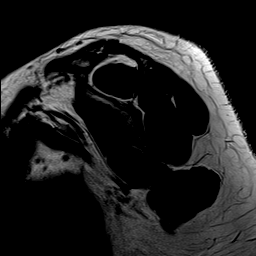
[im 6/21]
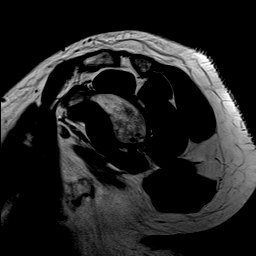
[im 9/21]
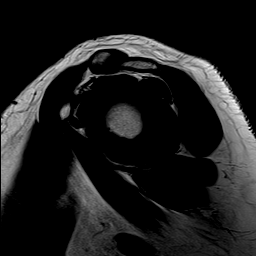
[im 12/21]
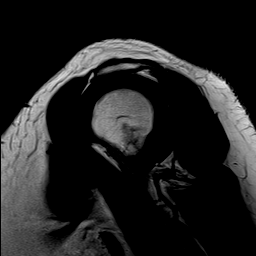
[im 15/21]
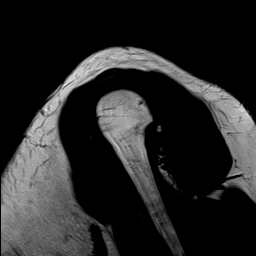
[im 18/21]
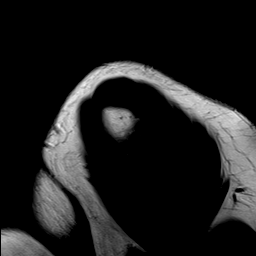
[im 21/21]
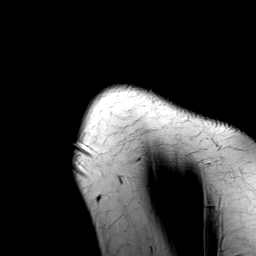

[Series 7: T2 fat-sat · oblique · 4.0mm · 0.62mm/px · 8 of 21 slices shown (3 of 3)]
[im 1/21]
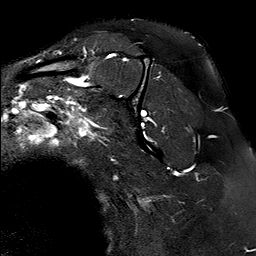
[im 3/21]
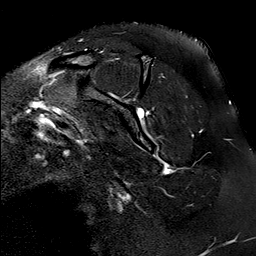
[im 6/21]
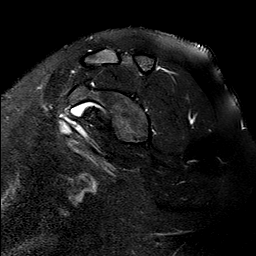
[im 9/21]
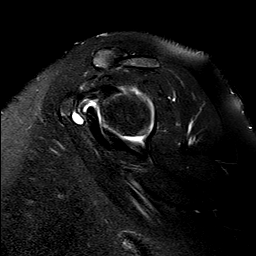
[im 12/21]
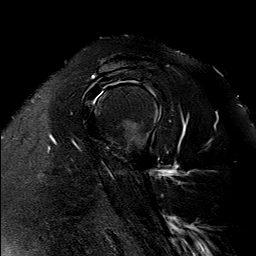
[im 15/21]
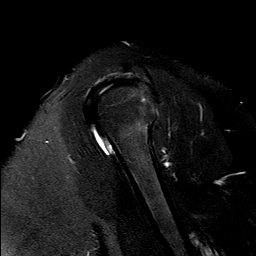
[im 18/21]
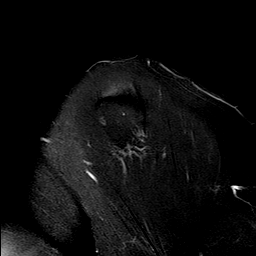
[im 21/21]
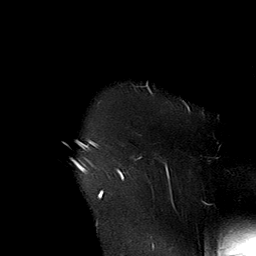

[40 of 40 positions shown; findings below may reference images not displayed]

FINDINGS: Rotator cuff:  Normal.

Muscles: No atrophy or abnormal signal of the muscles of the rotator
cuff.

Biceps long head:  Properly located and intact.

Acromioclavicular Joint:  Normal.  Type 1 acromion. No bursitis

Glenohumeral Joint: Small nonspecific effusion.  Otherwise normal

Labrum:  Normal.

Bones:  Normal.

Other: None
IMPRESSION: Small nonspecific glenohumeral joint effusion. This could represent
mild synovitis. Otherwise, normal exam.

## 2019-02-09 ENCOUNTER — Other Ambulatory Visit: Payer: Self-pay

## 2019-02-09 ENCOUNTER — Encounter: Payer: Self-pay | Admitting: Obstetrics and Gynecology

## 2019-02-09 ENCOUNTER — Ambulatory Visit (INDEPENDENT_AMBULATORY_CARE_PROVIDER_SITE_OTHER): Payer: Managed Care, Other (non HMO) | Admitting: Obstetrics and Gynecology

## 2019-02-09 VITALS — BP 150/99 | HR 106 | Wt 201.0 lb

## 2019-02-09 DIAGNOSIS — Z01419 Encounter for gynecological examination (general) (routine) without abnormal findings: Secondary | ICD-10-CM

## 2019-02-09 DIAGNOSIS — R030 Elevated blood-pressure reading, without diagnosis of hypertension: Secondary | ICD-10-CM

## 2019-02-09 DIAGNOSIS — Z3044 Encounter for surveillance of vaginal ring hormonal contraceptive device: Secondary | ICD-10-CM

## 2019-02-09 MED ORDER — CYCLOBENZAPRINE HCL 10 MG PO TABS: 10.0000 mg | ORAL_TABLET | Freq: Every day | ORAL | 0 refills | Status: AC | PRN

## 2019-02-09 MED ORDER — NORETHINDRONE 0.35 MG PO TABS: 1.0000 | ORAL_TABLET | Freq: Every day | ORAL | 3 refills | Status: AC

## 2019-02-09 NOTE — Progress Notes (Signed)
Obstetrics and Gynecology Annual Patient Evaluation  Appointment Date: 02/09/2019  OBGYN Clinic: [facility]  Primary Care Provider: Jerl Mina   Chief Complaint: Annual Exam  History of Present Illness: [name] is a 53 y.o. Caucasian [id number] (Patient's last menstrual period was 01/30/2019 (exact date).), seen for the above chief complaint. Her past medical history is significant for transient HTN (patient endorses white coat)   No climateric s/s, breast s/s, abdominal pain, VB, discharge, vaginal itching, hematuria, blood in BMs, diarrhea, constipation  Review of Systems:  as noted in the History of Present Illness.   Past Medical History:  Past Medical History:  Diagnosis Date  . Depression     Past Surgical History:  Past Surgical History:  Procedure Laterality Date  . BREAST LUMPECTOMY     RIGHT BREAST  . FOOT SURGERY     X2  . NASAL SINUS SURGERY      Past Obstetrical History:  OB History  Gravida Para Term Preterm AB Living  1 0     1    SAB TAB Ectopic Multiple Live Births  1       0    # Outcome Date GA Lbr Len/2nd Weight Sex Delivery Anes PTL Lv  1 SAB             Past Gynecological History: As per HPI. Periods: qmonth, regular, no intermenstrual bleeding History of Pap Smear(s): Yes.   Last pap 2016, which was negative She is currently using NuvaRing vaginal inserts for contraception, which she has been using for several years.   Social History:  Social History   Socioeconomic History  . Marital status: Married    Spouse name: Not on file  . Number of children: Not on file  . Years of education: Not on file  . Highest education level: Not on file  Occupational History  . Not on file  Tobacco Use  . Smoking status: Never Smoker  . Smokeless tobacco: Never Used  Substance and Sexual Activity  . Alcohol use: No  . Drug use: No  . Sexual activity: Yes    Partners: Male    Birth control/protection:  Inserts    Comment: nuva ring  Other Topics Concern  . Not on file  Social History Narrative  . Not on file   Social Determinants of Health   Financial Resource Strain:   . Difficulty of Paying Living Expenses: Not on file  Food Insecurity:   . Worried About Programme researcher, broadcasting/film/video in the Last Year: Not on file  . Ran Out of Food in the Last Year: Not on file  Transportation Needs:   . Lack of Transportation (Medical): Not on file  . Lack of Transportation (Non-Medical): Not on file  Physical Activity:   . Days of Exercise per Week: Not on file  . Minutes of Exercise per Session: Not on file  Stress:   . Feeling of Stress : Not on file  Social Connections:   . Frequency of Communication with Friends and Family: Not on file  . Frequency of Social Gatherings with Friends and Family: Not on file  . Attends Religious Services: Not on file  . Active Member of Clubs or Organizations: Not on file  . Attends Banker Meetings: Not on file  . Marital Status: Not on file  Intimate Partner Violence:   . Fear of Current or Ex-Partner: Not on file  . Emotionally Abused: Not on file  .  Physically Abused: Not on file  . Sexually Abused: Not on file    Family History:  Family History  Problem Relation Age of Onset  . Cancer Paternal Aunt        BREAST  . Cancer Father        Non-Hodgkins Lymphoma  . Hypertension Mother    She denies any female cancers, bleeding or blood clotting disorders.   Health Maintenance:  Mammogram(s): No Colonoscopy: No.   Medications Leonda R. Fischl had no medications administered during this visit. Current Outpatient Medications  Medication Sig Dispense Refill  . NUVARING 0.12-0.015 MG/24HR vaginal ring INSERT ONE RING VAGINALLY  FOR 3 WEEKS, REMOVE FOR 1  WEEK 3 each 3   No current facility-administered medications for this visit.    Allergies Patient has no known allergies.   Physical Exam:  BP (!) 150/99   Pulse (!) 106   Wt 201  lb (91.2 kg)   LMP 01/30/2019 (Exact Date)   BMI 33.45 kg/m  Body mass index is 33.45 kg/m. General appearance: Well nourished, well developed female in no acute distress.  Neck:  Supple, normal appearance, and no thyromegaly  Cardiovascular: normal s1 and s2.  No murmurs, rubs or gallops. Respiratory:  Clear to auscultation bilateral. Normal respiratory effort Abdomen: positive bowel sounds and no masses, hernias; diffusely non tender to palpation, non distended Breasts: breasts appear normal, no suspicious masses, no skin or nipple changes or axillary nodes, and palpation negative. Neuro/Psych:  Normal mood and affect.  Skin:  Warm and dry.  Lymphatic:  No inguinal lymphadenopathy.   Pelvic exam: is not limited by body habitus EGBUS: within normal limits, Vagina: within normal limits and with no blood or discharge in the vault, vaginal insert in place. Cervix: normal appearing cervix without tenderness, discharge or lesions. Uterus:  nonenlarged and non tender and Adnexa:  normal adnexa and no mass, fullness, tenderness Rectovaginal: deferred  Laboratory: none  Radiology: none  Assessment: pt stable  Plan:  1. Transient hypertension Continue to follow now off of estrogen containing nuvaring (see below)  2. Women's annual routine gynecological examination -D/w her re: mammogram and colonoscopy and patient to considering getting these. Patient has PCP, declines routine bloodwork today -d/w her re: nuvaring and recommend coming off estrogen containing options given age and HTN. Options d/w her: checking a few FSH levels after a month off the nuvaring; exp management and condom use; switching to a non estrogen containing method. Pt would like to do OCPs and d/w her re: progestin only pills and 3 hour window. I told her if she'd like to get a few FSHs that I'd allow about a month off hormones to see. I'll let her know if [place] levels influenced by POPs -follow up pap today   Orders  Placed This Encounter  Procedures  . Mammogram Screening Bilateral Tomo    RTC PRN  Durene Romans MD Attending Center for [facility])

## 2019-02-09 NOTE — Progress Notes (Signed)
Needs refill on nuva ring

## 2019-02-13 LAB — IGP, APTIMA HPV, RFX 16/18,45
HPV Aptima: NEGATIVE
PAP Smear Comment: 0

## 2019-03-06 ENCOUNTER — Telehealth: Payer: Self-pay | Admitting: Obstetrics and Gynecology

## 2019-03-06 NOTE — Telephone Encounter (Signed)
GYN Telephone Note [phone] and google assistant picked up and called at [phone] but no answer with ringing.   Will have office try to contact pt to go over pap results.  Cornelia Copa MD Attending [facility] for [facility] (Faculty Practice) 03/06/2019 Time: [phone]

## 2019-05-01 ENCOUNTER — Ambulatory Visit: Payer: Managed Care, Other (non HMO) | Attending: Internal Medicine

## 2019-05-01 DIAGNOSIS — Z23 Encounter for immunization: Secondary | ICD-10-CM

## 2019-05-01 NOTE — Progress Notes (Signed)
   Covid-19 Vaccination Clinic  Name:  [name]    MRN: [id number] DOB: 05/29/1965  05/01/2019  [name] was observed post Covid-19 immunization for 15 minutes without incident. She was provided with Vaccine Information Sheet and instruction to access the V-Safe system.   [name] was instructed to call 911 with any severe reactions post vaccine: Marland Kitchen Difficulty breathing  . Swelling of face and throat  . A fast heartbeat  . A bad rash all over body  . Dizziness and weakness   Immunizations Administered    Name Date Dose VIS Date Route   Moderna COVID-19 Vaccine 05/01/2019 10:57 AM 0.5 mL 01/13/2019 Intramuscular   Manufacturer: Moderna   Lot: [id number]   NDC: 26378-588-50

## 2019-06-03 ENCOUNTER — Ambulatory Visit: Payer: Managed Care, Other (non HMO) | Attending: Internal Medicine

## 2019-06-03 DIAGNOSIS — Z23 Encounter for immunization: Secondary | ICD-10-CM

## 2019-06-03 NOTE — Progress Notes (Signed)
   Covid-19 Vaccination Clinic  Name:  SAMANTHA RAGEN    MRN: [id number] DOB: 1965-05-08  06/03/2019  Ms. Dorko was observed post Covid-19 immunization for 15 minutes without incident. She was provided with Vaccine Information Sheet and instruction to access the V-Safe system.   Ms. [place] was instructed to call 911 with any severe reactions post vaccine: Marland Kitchen Difficulty breathing  . Swelling of face and throat  . A fast heartbeat  . A bad rash all over body  . Dizziness and weakness   Immunizations Administered    Name Date Dose VIS Date Route   Moderna COVID-19 Vaccine 06/03/2019  2:11 PM 0.5 mL 01/2019 Intramuscular   Manufacturer: Moderna   Lot: [id number]   NDC: 67289-791-50

## 2019-11-12 ENCOUNTER — Other Ambulatory Visit: Payer: Self-pay | Admitting: Obstetrics and Gynecology

## 2019-12-11 ENCOUNTER — Encounter: Payer: Self-pay | Admitting: Radiology

## 2021-12-19 ENCOUNTER — Other Ambulatory Visit: Payer: Self-pay | Admitting: Student

## 2021-12-19 DIAGNOSIS — Z1231 Encounter for screening mammogram for malignant neoplasm of breast: Secondary | ICD-10-CM

## 2022-07-10 ENCOUNTER — Inpatient Hospital Stay
Admission: RE | Admit: 2022-07-10 | Discharge: 2022-07-10 | Disposition: A | Discharge status: Home / Self Care | Payer: Self-pay | Source: Ambulatory Visit | Attending: Family Medicine | Admitting: Family Medicine

## 2022-07-10 ENCOUNTER — Other Ambulatory Visit: Payer: Self-pay | Admitting: *Deleted

## 2022-07-10 DIAGNOSIS — Z1231 Encounter for screening mammogram for malignant neoplasm of breast: Secondary | ICD-10-CM

## 2022-07-25 ENCOUNTER — Ambulatory Visit
Admission: RE | Admit: 2022-07-25 | Discharge: 2022-07-25 | Disposition: A | Discharge status: Home / Self Care | Payer: Managed Care, Other (non HMO) | Source: Ambulatory Visit | Attending: Student | Admitting: Student

## 2022-07-25 DIAGNOSIS — Z1231 Encounter for screening mammogram for malignant neoplasm of breast: Secondary | ICD-10-CM | POA: Diagnosis not present
